# Patient Record
Sex: Male | Born: 1970 | Race: White | Hispanic: No | Marital: Single | Smoking: Current every day smoker
Health system: Southern US, Community
[De-identification: ages and names within clinical notes are randomized; demographics above are authoritative.]

## PROBLEM LIST (undated history)

## (undated) DIAGNOSIS — N200 Calculus of kidney: Secondary | ICD-10-CM

## (undated) DIAGNOSIS — I10 Essential (primary) hypertension: Secondary | ICD-10-CM

## (undated) DIAGNOSIS — K219 Gastro-esophageal reflux disease without esophagitis: Secondary | ICD-10-CM

## (undated) DIAGNOSIS — T7840XA Allergy, unspecified, initial encounter: Secondary | ICD-10-CM

## (undated) DIAGNOSIS — R0902 Hypoxemia: Secondary | ICD-10-CM

## (undated) DIAGNOSIS — E785 Hyperlipidemia, unspecified: Secondary | ICD-10-CM

## (undated) HISTORY — DX: Essential (primary) hypertension: I10

## (undated) HISTORY — DX: Hypoxemia: R09.02

## (undated) HISTORY — PX: LITHOTRIPSY: SUR834

## (undated) HISTORY — PX: WISDOM TOOTH EXTRACTION: SHX21

## (undated) HISTORY — DX: Gastro-esophageal reflux disease without esophagitis: K21.9

## (undated) HISTORY — DX: Allergy, unspecified, initial encounter: T78.40XA

## (undated) HISTORY — PX: CHEST TUBE INSERTION: SHX231

## (undated) HISTORY — DX: Hyperlipidemia, unspecified: E78.5

## (undated) HISTORY — DX: Calculus of kidney: N20.0

## (undated) HISTORY — PX: ANTERIOR CRUCIATE LIGAMENT REPAIR: SHX115

---

## 1998-05-17 ENCOUNTER — Ambulatory Visit (HOSPITAL_COMMUNITY): Admission: RE | Admit: 1998-05-17 | Discharge: 1998-05-17 | Payer: Self-pay | Admitting: Urology

## 2004-05-12 ENCOUNTER — Emergency Department (HOSPITAL_COMMUNITY): Admission: EM | Admit: 2004-05-12 | Discharge: 2004-05-12 | Payer: Self-pay | Admitting: Emergency Medicine

## 2004-11-07 ENCOUNTER — Ambulatory Visit: Payer: Self-pay | Admitting: Internal Medicine

## 2004-11-11 ENCOUNTER — Encounter: Admission: RE | Admit: 2004-11-11 | Discharge: 2004-11-11 | Payer: Self-pay | Admitting: Internal Medicine

## 2006-02-16 IMAGING — CR DG CHEST 2V
2 series · 2 of 2 positions shown · non-contrast
Comparison: none

CLINICAL DATA: coughing blood
 TWO VIEW CHEST
 No comparison.  Heart size is upper normal.  There is mild elevation of the right hemidiaphragm and minimal right lower lobe atelectasis.  Lungs otherwise clear and there is no effusion.
 IMPRESSION
 Very mild right lower lobe atelectasis.

[view not recorded (1 of 2)]
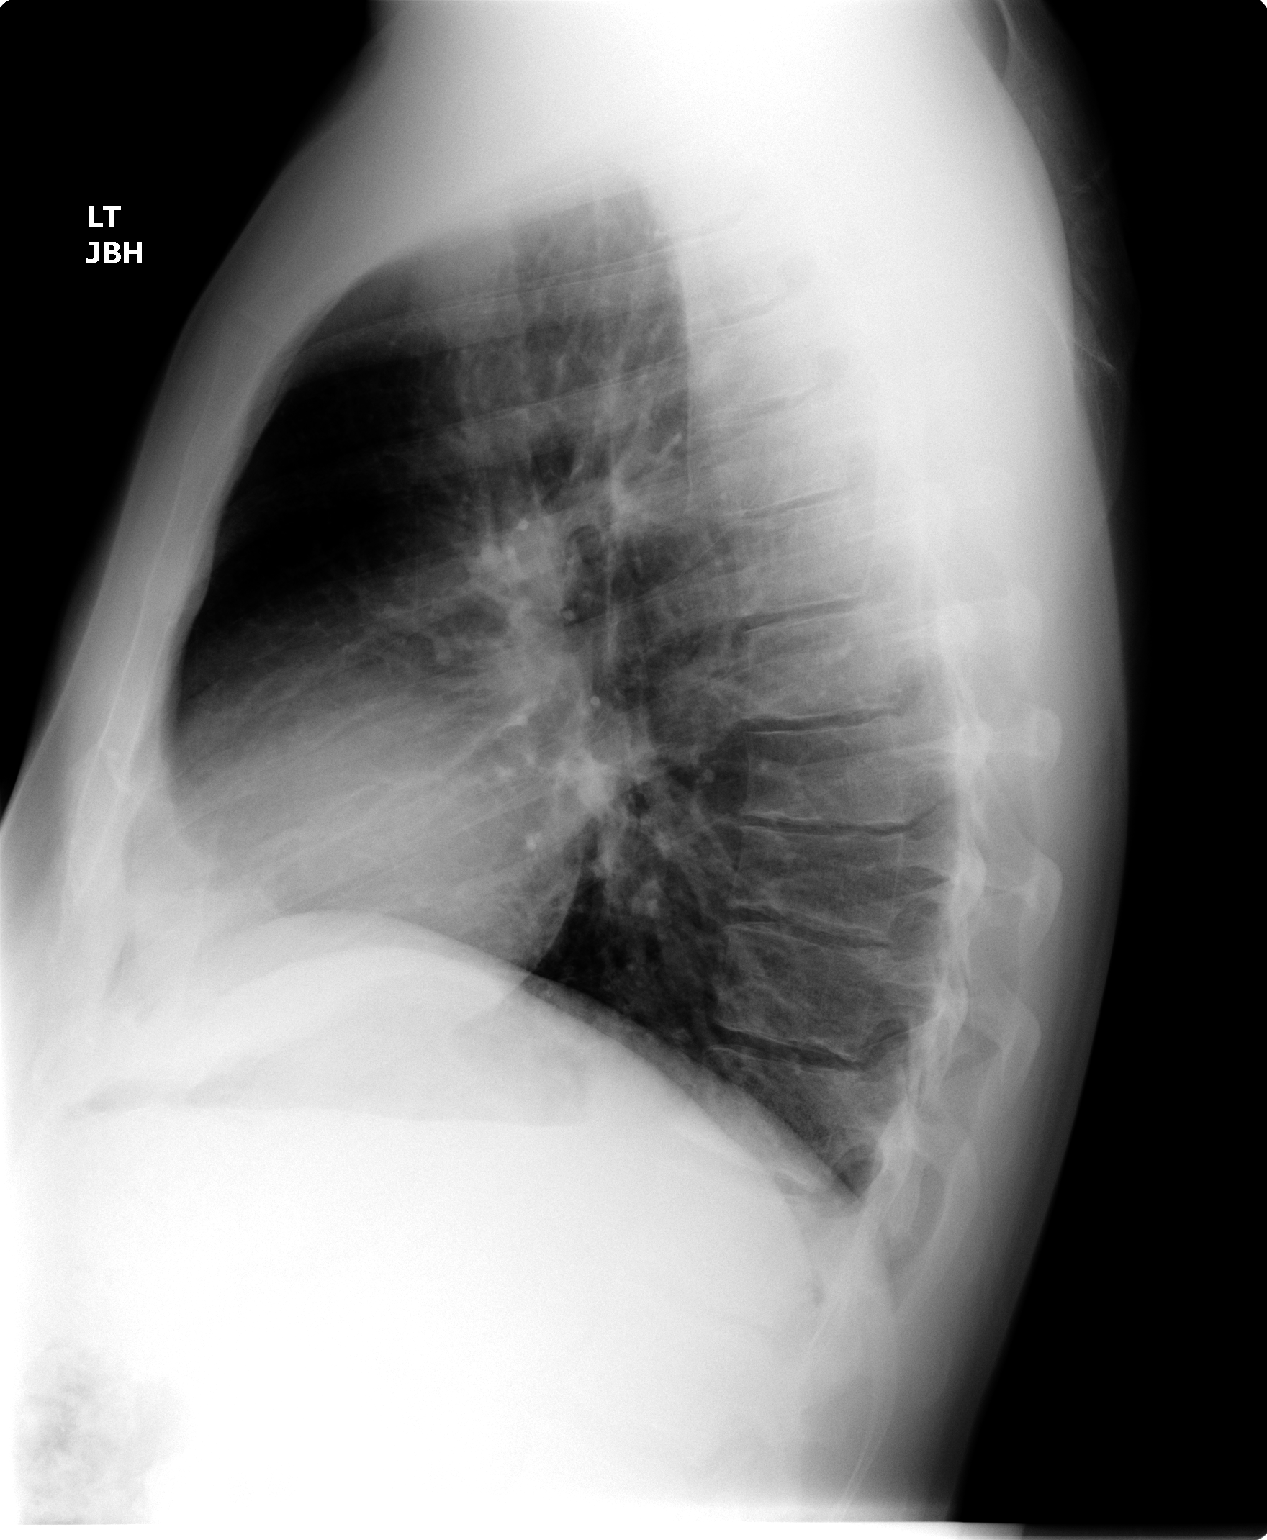

[view not recorded (2 of 2)]
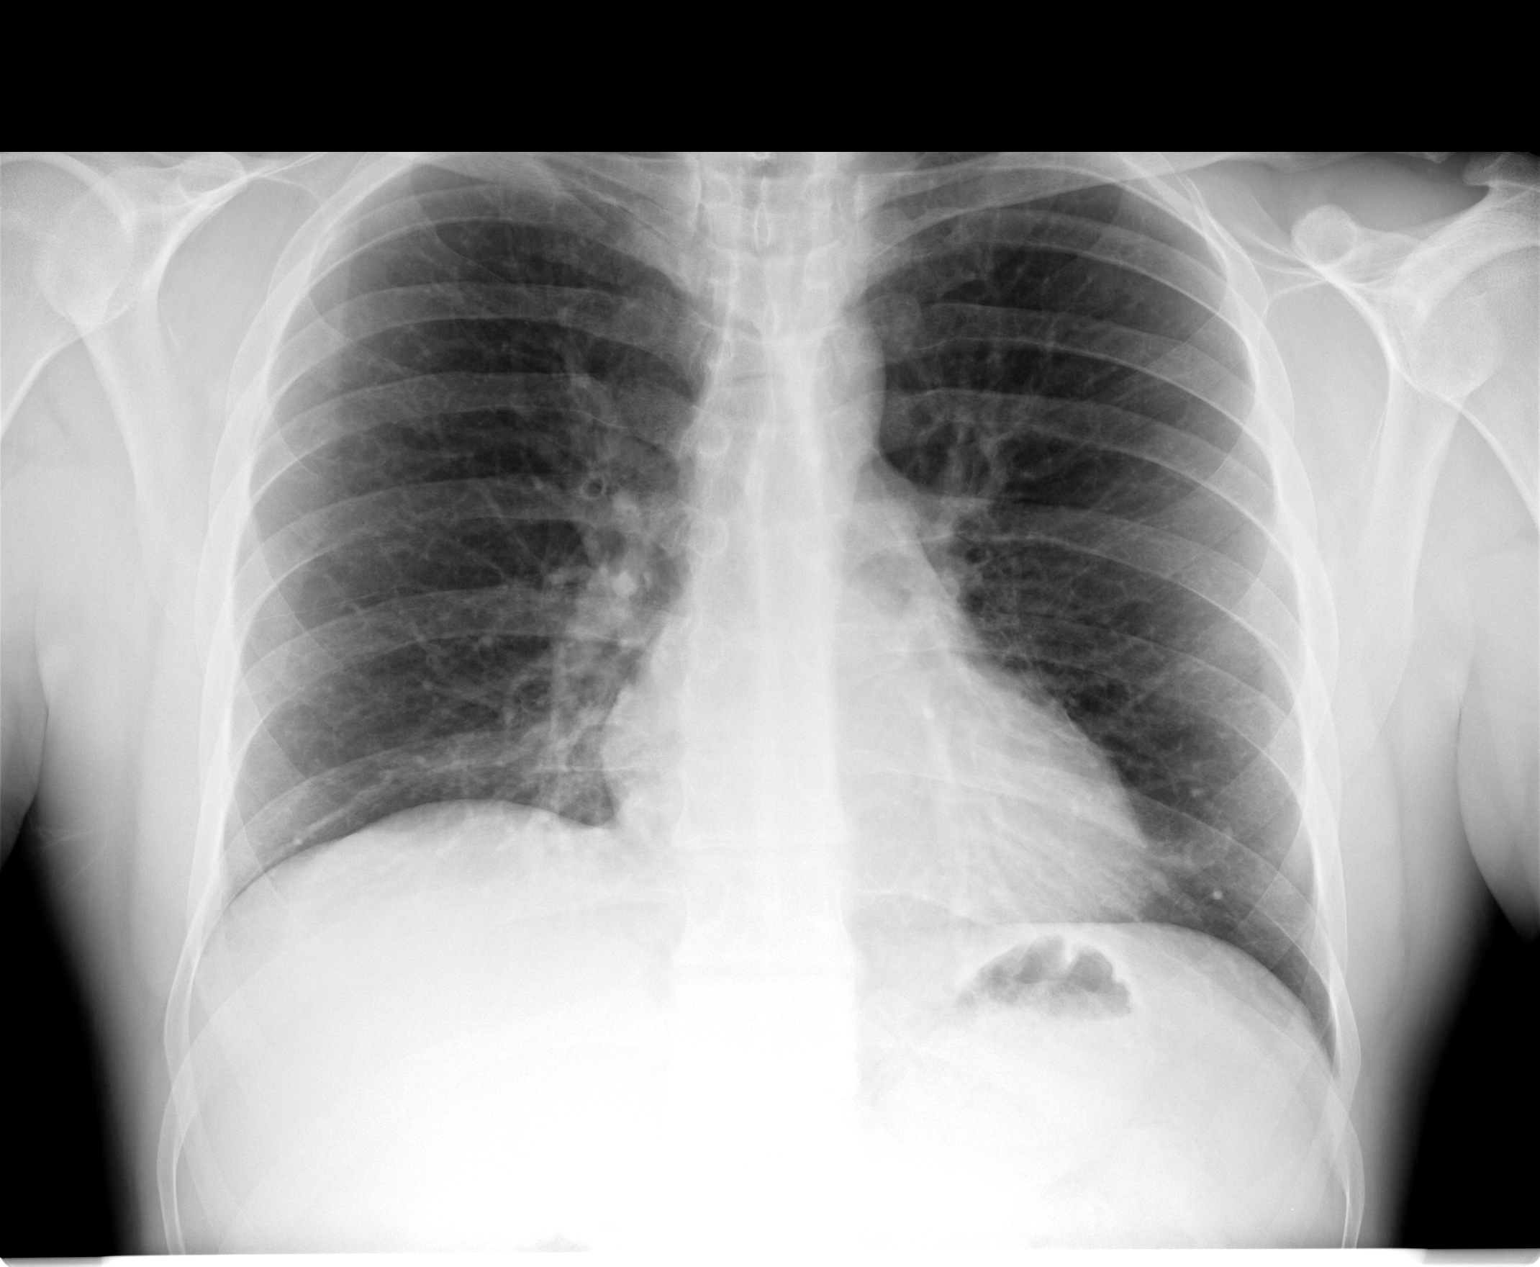

[2 of 2 positions shown; findings below may reference images not displayed]

## 2008-03-08 ENCOUNTER — Ambulatory Visit (HOSPITAL_COMMUNITY): Admission: RE | Admit: 2008-03-08 | Discharge: 2008-03-08 | Payer: Self-pay | Admitting: Urology

## 2011-03-04 NOTE — Op Note (Signed)
NAME:  Joshua Rivers, Joshua Rivers                 ACCOUNT NO.:  [id number]   MEDICAL RECORD NO.:  [id number]          PATIENT TYPE:  AMB   LOCATION:  DAY                          FACILITY:  [facility]   PHYSICIAN:  Maretta Bees. Vonita Moss, M.D.DATE OF BIRTH:  11-26-1970   DATE OF PROCEDURE:  03/08/2008  DATE OF DISCHARGE:                               OPERATIVE REPORT   PREOPERATIVE DIAGNOSIS:  Urethral stone and urinary retention.   POSTOPERATIVE DIAGNOSIS:  Urethral stone and urinary retention, plus  urethral stricture.   PROCEDURE:  Cystoscopy, urethral dilation, and removal of stone from the  urethra and bladder.   SURGEON:  Dr. Larey Dresser.   ANESTHESIA:  General.   INDICATIONS:  This gentleman presented today with left flank pain and  then suprapubic pain and was found to have a distended bladder on a KUB  after doing a CT, showed a large stone in the proximal urethra and I  could not tell whether it was in the distal prostate or bulbous urethra.  He was in severe distress from urinary retention.  He does have a past  history of stones.  I gave him Rapaflo but he did not pass the stone and  had to be brought to the OR.   PROCEDURE:  The patient was brought to the operating room and placed in  the lithotomy position.  The external genitalia were prepped and draped  in the usual fashion.  He was cystoscoped after dilating a mildly  stenotic urethral meatus and I encountered a short and somewhat dense  stricture in the bulbous urethra.  It was not pinpoint but obviously  narrow enough to prevent stone passage.  I had injected the lidocaine  jelly and apparently pushed the stone back in the bladder, where I found  it after I inserted a cystoscope.  His prostate was short and  nonobstructing.  I used a basket to retrieve the stone intact and gave  it to the patient's wife.  I then inserted a guidewire and dilated his  urethral, meatal and bulbous stricture to 48 [place].  In the course of  doing  the procedure, I also decompressed his distended bladder.  He was  then taken to the recovery room in good condition, having tolerated the  procedure well.      Maretta Bees. Vonita Moss, M.D.  Electronically Signed     LJP/MEDQ  D:  03/08/2008  T:  03/08/2008  Job:  [id number]

## 2011-10-04 ENCOUNTER — Ambulatory Visit (INDEPENDENT_AMBULATORY_CARE_PROVIDER_SITE_OTHER): Payer: 59

## 2011-10-04 DIAGNOSIS — L02219 Cutaneous abscess of trunk, unspecified: Secondary | ICD-10-CM

## 2011-10-08 ENCOUNTER — Ambulatory Visit (INDEPENDENT_AMBULATORY_CARE_PROVIDER_SITE_OTHER): Payer: 59

## 2011-10-08 DIAGNOSIS — L0291 Cutaneous abscess, unspecified: Secondary | ICD-10-CM

## 2011-10-08 DIAGNOSIS — L0390 Cellulitis, unspecified: Secondary | ICD-10-CM

## 2012-05-07 ENCOUNTER — Encounter: Payer: Self-pay | Admitting: Internal Medicine

## 2012-05-07 ENCOUNTER — Ambulatory Visit (INDEPENDENT_AMBULATORY_CARE_PROVIDER_SITE_OTHER): Payer: Self-pay | Admitting: Internal Medicine

## 2012-05-07 VITALS — BP 122/78 | HR 102 | Temp 98.6°F | Ht 73.0 in | Wt 174.0 lb

## 2012-05-07 DIAGNOSIS — F341 Dysthymic disorder: Secondary | ICD-10-CM

## 2012-05-07 DIAGNOSIS — F329 Major depressive disorder, single episode, unspecified: Secondary | ICD-10-CM

## 2012-05-07 DIAGNOSIS — F32A Depression, unspecified: Secondary | ICD-10-CM | POA: Insufficient documentation

## 2012-05-07 MED ORDER — ESCITALOPRAM OXALATE 10 MG PO TABS: 10.0000 mg | ORAL_TABLET | Freq: Every day | ORAL | Status: DC
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
## 2012-05-07 NOTE — Patient Instructions (Addendum)
Start Lexapro 10 mg once a day Come back for a checkup in one week If you feel like the suicidal thoughts or thoughts of violence are coming back immediately stop Lexapro and call [phone] Please contact one of the counselors in town. See list.
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
## 2012-05-14 ENCOUNTER — Ambulatory Visit (INDEPENDENT_AMBULATORY_CARE_PROVIDER_SITE_OTHER): Payer: Self-pay | Admitting: Internal Medicine

## 2012-05-14 VITALS — BP 138/84 | HR 104 | Temp 98.1°F | Wt 179.0 lb

## 2012-05-14 DIAGNOSIS — F419 Anxiety disorder, unspecified: Secondary | ICD-10-CM

## 2012-05-14 DIAGNOSIS — F341 Dysthymic disorder: Secondary | ICD-10-CM

## 2012-05-14 MED ORDER — ESCITALOPRAM OXALATE 10 MG PO TABS: 10.0000 mg | ORAL_TABLET | Freq: Every day | ORAL | Status: DC
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
## 2012-05-14 NOTE — Patient Instructions (Addendum)
Please come back for a follow up in 6-8 weeks. Call anytime if   needed.

## 2012-05-14 NOTE — Assessment & Plan Note (Signed)
Here for a followup, fortunately, Lexapro 10 mg is helping, he feels better. Plan: Continue with Lexapro Encourage to seek counseling Followup 6-8 weeks, now also call anytime if needed. Knows to call the crisis line if needed.

## 2012-05-16 ENCOUNTER — Encounter: Payer: Self-pay | Admitting: Internal Medicine

## 2012-07-02 ENCOUNTER — Ambulatory Visit: Payer: 59 | Admitting: Internal Medicine

## 2012-07-02 DIAGNOSIS — Z0289 Encounter for other administrative examinations: Secondary | ICD-10-CM

## 2016-06-24 ENCOUNTER — Encounter: Payer: Self-pay | Admitting: Internal Medicine

## 2016-07-04 ENCOUNTER — Ambulatory Visit (INDEPENDENT_AMBULATORY_CARE_PROVIDER_SITE_OTHER): Payer: 59 | Admitting: Internal Medicine

## 2016-07-04 ENCOUNTER — Encounter: Payer: Self-pay | Admitting: Internal Medicine

## 2016-07-04 VITALS — BP 158/94 | HR 92 | Temp 98.2°F | Resp 16 | Ht 73.0 in | Wt 200.0 lb

## 2016-07-04 DIAGNOSIS — Z136 Encounter for screening for cardiovascular disorders: Secondary | ICD-10-CM | POA: Diagnosis not present

## 2016-07-04 DIAGNOSIS — Z130 Encounter for screening for diseases of the blood and blood-forming organs and certain disorders involving the immune mechanism: Secondary | ICD-10-CM

## 2016-07-04 DIAGNOSIS — Z13220 Encounter for screening for lipoid disorders: Secondary | ICD-10-CM

## 2016-07-04 DIAGNOSIS — R030 Elevated blood-pressure reading, without diagnosis of hypertension: Secondary | ICD-10-CM

## 2016-07-04 DIAGNOSIS — Z131 Encounter for screening for diabetes mellitus: Secondary | ICD-10-CM

## 2016-07-04 DIAGNOSIS — E559 Vitamin D deficiency, unspecified: Secondary | ICD-10-CM

## 2016-07-04 DIAGNOSIS — Z1329 Encounter for screening for other suspected endocrine disorder: Secondary | ICD-10-CM

## 2016-07-04 DIAGNOSIS — F329 Major depressive disorder, single episode, unspecified: Secondary | ICD-10-CM

## 2016-07-04 DIAGNOSIS — E349 Endocrine disorder, unspecified: Secondary | ICD-10-CM

## 2016-07-04 DIAGNOSIS — Z1389 Encounter for screening for other disorder: Secondary | ICD-10-CM

## 2016-07-04 DIAGNOSIS — Z0000 Encounter for general adult medical examination without abnormal findings: Secondary | ICD-10-CM | POA: Diagnosis not present

## 2016-07-04 DIAGNOSIS — I10 Essential (primary) hypertension: Secondary | ICD-10-CM | POA: Diagnosis not present

## 2016-07-04 DIAGNOSIS — Z125 Encounter for screening for malignant neoplasm of prostate: Secondary | ICD-10-CM

## 2016-07-04 DIAGNOSIS — IMO0001 Reserved for inherently not codable concepts without codable children: Secondary | ICD-10-CM

## 2016-07-04 DIAGNOSIS — F419 Anxiety disorder, unspecified: Secondary | ICD-10-CM

## 2016-07-04 LAB — CBC WITH DIFFERENTIAL/PLATELET
Basophils Absolute: 108 cells/uL (ref 0–200)
Basophils Relative: 1 %
EOS ABS: 216 {cells}/uL (ref 15–500)
Eosinophils Relative: 2 %
HEMATOCRIT: 43.3 % (ref 38.5–50.0)
Hemoglobin: 14.9 g/dL (ref 13.2–17.1)
Lymphocytes Relative: 20 %
Lymphs Abs: 2160 cells/uL (ref 850–3900)
MCH: 31 pg (ref 27.0–33.0)
MCHC: 34.4 g/dL (ref 32.0–36.0)
MCV: 90 fL (ref 80.0–100.0)
MPV: 9 fL (ref 7.5–12.5)
Monocytes Absolute: 972 cells/uL — ABNORMAL HIGH (ref 200–950)
Monocytes Relative: 9 %
NEUTROS ABS: 7344 {cells}/uL (ref 1500–7800)
Neutrophils Relative %: 68 %
Platelets: 382 10*3/uL (ref 140–400)
RBC: 4.81 MIL/uL (ref 4.20–5.80)
RDW: 13.2 % (ref 11.0–15.0)
WBC: 10.8 10*3/uL (ref 3.8–10.8)

## 2016-07-04 LAB — PSA: PSA: 0.8 ng/mL (ref <=4.0)

## 2016-07-04 LAB — HEPATIC FUNCTION PANEL
ALK PHOS: 69 U/L (ref 40–115)
ALT: 32 U/L (ref 9–46)
AST: 27 U/L (ref 10–40)
Albumin: 4.4 g/dL (ref 3.6–5.1)
BILIRUBIN DIRECT: 0.1 mg/dL (ref <=0.2)
BILIRUBIN INDIRECT: 0.3 mg/dL (ref 0.2–1.2)
BILIRUBIN TOTAL: 0.4 mg/dL (ref 0.2–1.2)
TOTAL PROTEIN: 6.7 g/dL (ref 6.1–8.1)

## 2016-07-04 LAB — IRON AND TIBC
%SAT: 28 % (ref 15–60)
Iron: 102 ug/dL (ref 50–180)
TIBC: 365 ug/dL (ref 250–425)
UIBC: 263 ug/dL (ref 125–400)

## 2016-07-04 LAB — LIPID PANEL
CHOLESTEROL: 246 mg/dL — AB (ref 125–200)
HDL: 31 mg/dL — ABNORMAL LOW (ref >=40)
LDL Cholesterol: 144 mg/dL — ABNORMAL HIGH (ref <130)
Total CHOL/HDL Ratio: 7.9 Ratio — ABNORMAL HIGH (ref <=5.0)
Triglycerides: 357 mg/dL — ABNORMAL HIGH (ref <150)
VLDL: 71 mg/dL — AB (ref <30)

## 2016-07-04 LAB — VITAMIN B12: Vitamin B-12: 519 pg/mL (ref 200–1100)

## 2016-07-04 LAB — BASIC METABOLIC PANEL WITH GFR
BUN: 11 mg/dL (ref 7–25)
CALCIUM: 9.6 mg/dL (ref 8.6–10.3)
CO2: 26 mmol/L (ref 20–31)
Chloride: 107 mmol/L (ref 98–110)
Creat: 0.85 mg/dL (ref 0.60–1.35)
GLUCOSE: 75 mg/dL (ref 65–99)
POTASSIUM: 4.1 mmol/L (ref 3.5–5.3)
SODIUM: 140 mmol/L (ref 135–146)

## 2016-07-04 LAB — MAGNESIUM: Magnesium: 1.9 mg/dL (ref 1.5–2.5)

## 2016-07-04 LAB — TSH: TSH: 0.65 mIU/L (ref 0.40–4.50)

## 2016-07-04 MED ORDER — VORTIOXETINE HBR 10 MG PO TABS: 10.0000 mg | ORAL_TABLET | Freq: Every day | ORAL | 0 refills | Status: DC

## 2016-07-04 MED ORDER — ALPRAZOLAM 0.5 MG PO TABS: 0.5000 mg | ORAL_TABLET | Freq: Two times a day (BID) | ORAL | 0 refills | Status: DC | PRN

## 2016-07-04 NOTE — Patient Instructions (Signed)
Alprazolam tablets What is this medicine? ALPRAZOLAM (al PRAY zoe lam) is a benzodiazepine. It is used to treat anxiety and panic attacks. This medicine may be used for other purposes; ask your health care provider or pharmacist if you have questions. What should I tell my health care provider before I take this medicine? They need to know if you have any of these conditions: -an alcohol or drug abuse problem -bipolar disorder, depression, psychosis or other mental health conditions -glaucoma -kidney or liver disease -lung or breathing disease -myasthenia gravis -Parkinson's disease -porphyria -seizures or a history of seizures -suicidal thoughts -an unusual or allergic reaction to alprazolam, other benzodiazepines, foods, dyes, or preservatives -pregnant or trying to get pregnant -breast-feeding How should I use this medicine? Take this medicine by mouth with a glass of water. Follow the directions on the prescription label. Take your medicine at regular intervals. Do not take it more often than directed. If you have been taking this medicine regularly for some time, do not suddenly stop taking it. You must gradually reduce the dose or you may get severe side effects. Ask your doctor or health care professional for advice. Even after you stop taking this medicine it can still affect your body for several days. Talk to your pediatrician regarding the use of this medicine in children. Special care may be needed. Overdosage: If you think you have taken too much of this medicine contact a poison control center or emergency room at once. NOTE: This medicine is only for you. Do not share this medicine with others. What if I miss a dose? If you miss a dose, take it as soon as you can. If it is almost time for your next dose, take only that dose. Do not take double or extra doses. What may interact with this medicine? Do not take this medicine with any of the following medications: -certain  medicines for HIV infection or AIDS -ketoconazole -itraconazole This medicine may also interact with the following medications: -birth control pills -certain macrolide antibiotics like clarithromycin, erythromycin, troleandomycin -cimetidine -cyclosporine -ergotamine -grapefruit juice -herbal or dietary supplements like kava kava, melatonin, dehydroepiandrosterone, DHEA, St. John's Wort or valerian -imatinib, STI-571 -isoniazid -levodopa -medicines for depression, anxiety, or psychotic disturbances -prescription pain medicines -rifampin, rifapentine, or rifabutin -some medicines for blood pressure or heart problems -some medicines for seizures like carbamazepine, oxcarbazepine, phenobarbital, phenytoin, primidone This list may not describe all possible interactions. Give your health care provider a list of all the medicines, herbs, non-prescription drugs, or dietary supplements you use. Also tell them if you smoke, drink alcohol, or use illegal drugs. Some items may interact with your medicine. What should I watch for while using this medicine? Visit your doctor or health care professional for regular checks on your progress. Your body can become dependent on this medicine. Ask your doctor or health care professional if you still need to take it. You may get drowsy or dizzy. Do not drive, use machinery, or do anything that needs mental alertness until you know how this medicine affects you. To reduce the risk of dizzy and fainting spells, do not stand or sit up quickly, especially if you are an older patient. Alcohol may increase dizziness and drowsiness. Avoid alcoholic drinks. Do not treat yourself for coughs, colds or allergies without asking your doctor or health care professional for advice. Some ingredients can increase possible side effects. What side effects may I notice from receiving this medicine? Side effects that you should report to your   doctor or health care professional as  soon as possible: -allergic reactions like skin rash, itching or hives, swelling of the face, lips, or tongue -confusion, forgetfulness -depression -difficulty sleeping -difficulty speaking -feeling faint or lightheaded, falls -mood changes, excitability or aggressive behavior -muscle cramps -trouble passing urine or change in the amount of urine -unusually weak or tired Side effects that usually do not require medical attention (report to your doctor or health care professional if they continue or are bothersome): -change in sex drive or performance -changes in appetite This list may not describe all possible side effects. Call your doctor for medical advice about side effects. You may report side effects to FDA at 1-800-FDA-1088. Where should I keep my medicine? Keep out of the reach of children. This medicine can be abused. Keep your medicine in a safe place to protect it from theft. Do not share this medicine with anyone. Selling or giving away this medicine is dangerous and against the law. Store at room temperature between 20 and 25 degrees C (68 and 77 degrees F). This medicine may cause accidental overdose and death if taken by other adults, children, or pets. Mix any unused medicine with a substance like cat litter or coffee grounds. Then throw the medicine away in a sealed container like a sealed bag or a coffee can with a lid. Do not use the medicine after the expiration date. NOTE: This sheet is a summary. It may not cover all possible information. If you have questions about this medicine, talk to your doctor, pharmacist, or health care provider.    2016, Elsevier/Gold Standard. (2014-06-27 14:51:36)  
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
## 2016-07-05 LAB — URINALYSIS, ROUTINE W REFLEX MICROSCOPIC
Bilirubin Urine: NEGATIVE
GLUCOSE, UA: NEGATIVE
Hgb urine dipstick: NEGATIVE
Ketones, ur: NEGATIVE
LEUKOCYTES UA: NEGATIVE
Nitrite: NEGATIVE
PROTEIN: NEGATIVE
Specific Gravity, Urine: 1.005 (ref 1.001–1.035)
pH: 6.5 (ref 5.0–8.0)

## 2016-07-05 LAB — INSULIN, RANDOM: INSULIN: 6.8 u[IU]/mL (ref 2.0–19.6)

## 2016-07-05 LAB — MICROALBUMIN / CREATININE URINE RATIO
CREATININE, URINE: 40 mg/dL (ref 20–370)
MICROALB UR: 0.4 mg/dL
MICROALB/CREAT RATIO: 10 ug/mg{creat} (ref <30)

## 2016-07-05 LAB — TESTOSTERONE: TESTOSTERONE: 317 ng/dL (ref 250–827)

## 2016-07-05 LAB — HEMOGLOBIN A1C
Hgb A1c MFr Bld: 5.2 % (ref <5.7)
MEAN PLASMA GLUCOSE: 103 mg/dL

## 2016-07-05 LAB — VITAMIN D 25 HYDROXY (VIT D DEFICIENCY, FRACTURES): VIT D 25 HYDROXY: 24 ng/mL — AB (ref 30–100)

## 2016-07-30 ENCOUNTER — Ambulatory Visit (INDEPENDENT_AMBULATORY_CARE_PROVIDER_SITE_OTHER): Payer: 59 | Admitting: Internal Medicine

## 2016-07-30 ENCOUNTER — Encounter: Payer: Self-pay | Admitting: Internal Medicine

## 2016-07-30 VITALS — BP 144/82 | HR 88 | Temp 98.0°F | Resp 16 | Ht 73.0 in | Wt 204.0 lb

## 2016-07-30 DIAGNOSIS — I10 Essential (primary) hypertension: Secondary | ICD-10-CM | POA: Diagnosis not present

## 2016-07-30 DIAGNOSIS — F411 Generalized anxiety disorder: Secondary | ICD-10-CM

## 2016-07-30 MED ORDER — BISOPROLOL-HYDROCHLOROTHIAZIDE 5-6.25 MG PO TABS: 1.0000 | ORAL_TABLET | Freq: Every day | ORAL | 3 refills | Status: DC

## 2016-07-30 MED ORDER — VORTIOXETINE HBR 10 MG PO TABS: 10.0000 mg | ORAL_TABLET | Freq: Every day | ORAL | 1 refills | Status: DC

## 2016-07-30 NOTE — Patient Instructions (Signed)
Bisoprolol; Hydrochlorothiazide, HCTZ tablets  What is this medicine?  BISOPROLOL; HYDROCHLOROTHIAZIDE (bis OH proe lol; hye droe klor oh THYE a zide) is a combination of a beta-blocker and a diuretic. It is used to treat high blood pressure.  This medicine may be used for other purposes; ask your health care provider or pharmacist if you have questions.  What should I tell my health care provider before I take this medicine?  They need to know if you have any of these conditions:  -circulation problems, or blood vessel disease  -decreased urine  -diabetes  -heart disease, heart failure or a history of heart attack  -kidney disease  -liver disease  -lung or breathing disease, like asthma  -slow heart rate  -thyroid disease  -an unusual or allergic reaction to hydrochlorothiazide, bisoprolol, sulfa drugs, other medicines, foods, dyes, or preservatives  -pregnant or trying to get pregnant  -breast-feeding  How should I use this medicine?  Take this medicine by mouth with a glass of water. Follow the directions on the prescription label. You can take it with or without food. If it upsets your stomach, take it with food. Take your medicine at regular intervals. Do not take it more often than directed. Do not stop taking except on your doctor's advice.  Talk to your pediatrician regarding the use of this medicine in children. Special care may be needed.  Overdosage: If you think you have taken too much of this medicine contact a poison control center or emergency room at once.  NOTE: This medicine is only for you. Do not share this medicine with others.  What if I miss a dose?  If you miss a dose, take it as soon as you can. If it is almost time for your next dose, take only that dose. Do not take double or extra doses.  What may interact with this medicine?  -barbiturates, like phenobarbital  -cholestyramine  -colestipol  -corticosteroids, like prednisone  -lithium  -medicines for chest pain or angina  -medicines for  diabetes  -medicines for high blood pressure or heart failure  -medicines to control heart rhythm  -NSAIDs, medicines for pain and inflammation, like ibuprofen or naproxen  -prescription pain medicines  -rifampin  -skeletal muscle relaxants like tubocurarine  This list may not describe all possible interactions. Give your health care provider a list of all the medicines, herbs, non-prescription drugs, or dietary supplements you use. Also tell them if you smoke, drink alcohol, or use illegal drugs. Some items may interact with your medicine.  What should I watch for while using this medicine?  Visit your doctor or health care professional for regular checks on your progress. Check your blood pressure as directed. Ask your doctor or health care professional what your blood pressure should be and when you should contact him or her.  Check with your doctor or health care professional if you get an attack of severe diarrhea, nausea and vomiting, or if you sweat a lot. The loss of too much body fluid can make it dangerous for you to take this medicine.  You may get drowsy or dizzy. Do not drive, use machinery, or do anything that needs mental alertness until you know how this drug affects you. Do not stand or sit up quickly, especially if you are an older patient. This reduces the risk of dizzy or fainting spells. Alcohol can make you more drowsy and dizzy. Avoid alcoholic drinks.  This medicine may affect your blood sugar level. If you   have diabetes, check with your doctor or health care professional before changing the dose of your diabetic medicine.  This medicine can make you more sensitive to the sun. Keep out of the sun. If you cannot avoid being in the sun, wear protective clothing and use sunscreen. Do not use sun lamps or tanning beds/booths.  Do not treat yourself for coughs, colds, or pain while you are taking this medicine without asking your doctor or health care professional for advice. Some ingredients may  increase your blood pressure.  What side effects may I notice from receiving this medicine?  Side effects that you should report to your doctor or health care professional as soon as possible:  -allergic reactions like skin rash, itching or hives, swelling of the face, lips, or tongue  -breathing problems  -changes in vision  -chest pain  -cold, tingling, or numb hands or feet  -eye pain  -fast, irregular, or slow heartbeat  -increased thirst or sweating  -muscle cramps  -redness, blistering, peeling or loosening of the skin, including inside the mouth  -swollen legs or ankles  -tremors  -unusual bruising  -unusual weak or tired  -vomiting  -worsened gout pain  -yellowing of the eyes or skin  Side effects that usually do not require medical attention (report to your doctor or health care professional if they continue or are bothersome):  -change in sex drive or performance  -cough  -depression  -diarrhea  -nausea  This list may not describe all possible side effects. Call your doctor for medical advice about side effects. You may report side effects to FDA at 1-800-FDA-1088.  Where should I keep my medicine?  Keep out of the reach of children.  Store at room temperature between 15 and 30 degrees C (59 and 86 degrees F). Keep container tightly closed. Throw away any unused medicine after the expiration date.  NOTE: This sheet is a summary. It may not cover all possible information. If you have questions about this medicine, talk to your doctor, pharmacist, or health care provider.     © 2016, Elsevier/Gold Standard. (2010-06-26 12:53:54)

## 2016-07-30 NOTE — Progress Notes (Signed)
   Subjective:    Patient ID: Joshua Rivers, male    DOB: October 17, 1971, 45 y.o.   MRN: [id number]  HPI  Patient reports that he has been having some high blood pressure readings at work and also at home.  He reports that he had a high reading at work a couple days ago of 170/116.  He was asymptomatic.  He had no headache, no blurry vision, no double vision.  He reports that he felt fine.  He was having readings of 162/100, and 150/97.  He reports that these were all taken at work.  He has a machine that he can check at the [place].     Review of Systems  Constitutional: Negative for chills, fatigue and fever.  Respiratory: Negative for chest tightness and shortness of breath.   Cardiovascular: Negative for chest pain and palpitations.  Gastrointestinal: Negative for abdominal pain, constipation, diarrhea, nausea and vomiting.  Genitourinary: Negative for difficulty urinating.  Neurological: Negative for dizziness, syncope, weakness and light-headedness.  Psychiatric/Behavioral: Negative for confusion and decreased concentration.       Objective:   Physical Exam  Constitutional: He is oriented to person, place, and time. He appears well-developed and well-nourished. No distress.  HENT:  Head: Normocephalic.  Mouth/Throat: Oropharynx is clear and moist. No oropharyngeal exudate.  Eyes: Conjunctivae are normal. No scleral icterus.  Neck: Normal range of motion. Neck supple. No JVD present. No thyromegaly present.  Cardiovascular: Normal rate, regular rhythm, normal heart sounds and intact distal pulses.  Exam reveals no gallop and no friction rub.   No murmur heard. Pulmonary/Chest: Effort normal and breath sounds normal. No respiratory distress. He has no wheezes. He has no rales. He exhibits no tenderness.  Abdominal: Soft. Bowel sounds are normal. He exhibits no distension and no mass. There is no tenderness. There is no rebound and no guarding.  Musculoskeletal: Normal range of  motion.  Lymphadenopathy:    He has no cervical adenopathy.  Neurological: He is alert and oriented to person, place, and time. No cranial nerve deficit. Coordination normal.  Skin: Skin is warm and dry. No rash noted. He is not diaphoretic.  Psychiatric: He has a normal mood and affect. His behavior is normal. Judgment and thought content normal.  Nursing note and vitals reviewed.   Vitals:   07/30/16 0942  BP: (!) 144/82  Pulse: 88  Resp: 16  Temp: 98 F (36.7 C)          Assessment & Plan:    1. Benign essential HTN -monitor BP -ziac 5/6.25 -call office if BP 150/90 or more -repeat OV Nov 1,2017  2. Generalized anxiety disorder -good relief with trintellix -cont meds and refill sent in

## 2016-08-20 ENCOUNTER — Ambulatory Visit (INDEPENDENT_AMBULATORY_CARE_PROVIDER_SITE_OTHER): Payer: 59 | Admitting: Internal Medicine

## 2016-08-20 ENCOUNTER — Encounter: Payer: Self-pay | Admitting: Internal Medicine

## 2016-08-20 VITALS — BP 124/70 | HR 78 | Temp 98.2°F | Resp 18 | Ht 73.0 in

## 2016-08-20 DIAGNOSIS — I10 Essential (primary) hypertension: Secondary | ICD-10-CM

## 2016-08-20 DIAGNOSIS — F418 Other specified anxiety disorders: Secondary | ICD-10-CM

## 2016-08-20 DIAGNOSIS — F329 Major depressive disorder, single episode, unspecified: Secondary | ICD-10-CM

## 2016-08-20 DIAGNOSIS — F419 Anxiety disorder, unspecified: Principal | ICD-10-CM

## 2016-08-20 HISTORY — DX: Essential (primary) hypertension: I10

## 2016-08-20 MED ORDER — VORTIOXETINE HBR 10 MG PO TABS: 10.0000 mg | ORAL_TABLET | Freq: Every day | ORAL | 1 refills | Status: DC

## 2016-08-20 MED ORDER — VARENICLINE TARTRATE 1 MG PO TABS: 1.0000 mg | ORAL_TABLET | Freq: Two times a day (BID) | ORAL | 1 refills | Status: DC

## 2016-08-20 MED ORDER — ALPRAZOLAM 0.5 MG PO TABS: 0.5000 mg | ORAL_TABLET | Freq: Two times a day (BID) | ORAL | 2 refills | Status: DC | PRN

## 2016-08-20 NOTE — Progress Notes (Signed)
Assessment and Plan:   1. Essential hypertension -cont ziac -well controlled -dash diet -exercise as tolerated  2. Anxiety and depression -cont trintellix -refilled xanax and gave a refill on paper prescriptions.    HPI 45 y.o.male presents for 1 month follow up of HTN. Patient reports that they have been doing well.  male is taking their medication.  They are not having difficulty with their medications.  They report no adverse reactions.    He reports that he is doing really well with trintellix too.  He is taking the xanax as sparingly as possible.  He sometimes does have to take it twice daily but sometimes he is not taking anything at all.      Past Medical History:  Diagnosis Date  . Allergy   . GERD (gastroesophageal reflux disease)    takes prilosec every day  . Kidney stones    last stone was in 2010     No Known Allergies    Current Outpatient Prescriptions on File Prior to Visit  Medication Sig Dispense Refill  . ALPRAZolam (XANAX) 0.5 MG tablet Take 1 tablet (0.5 mg total) by mouth 2 (two) times daily as needed for anxiety (severe anxiety). 60 tablet 0  . bisoprolol-hydrochlorothiazide (ZIAC) 5-6.25 MG tablet Take 1 tablet by mouth daily. 30 tablet 3  . varenicline (CHANTIX PAK) 0.5 MG X 11 & 1 MG X 42 tablet Take by mouth 2 (two) times daily. Take one 0.5 mg tablet by mouth once daily for 3 days, then increase to one 0.5 mg tablet twice daily for 4 days, then increase to one 1 mg tablet twice daily.    Marland Kitchen. vortioxetine HBr (TRINTELLIX) 10 MG TABS Take 1 tablet (10 mg total) by mouth daily. 90 tablet 1   No current facility-administered medications on file prior to visit.     ROS: all negative except above.   Physical Exam: There were no vitals filed for this visit. BP 124/70   Pulse 78   Temp 98.2 F (36.8 C) (Temporal)   Resp 18   Ht 6\' 1"  (1.854 m)  General Appearance: Well developed well nourished, non-toxic appearing in no apparent distress. Eyes:  PERRLA, EOMs, conjunctiva w/ no swelling or erythema or discharge Sinuses: No Frontal/maxillary tenderness ENT/Mouth: Ear canals clear without swelling or erythema.  TM's normal bilaterally with no retractions, bulging, or loss of landmarks.   Neck: Supple, thyroid normal, no notable JVD  Respiratory: Respiratory effort normal, Clear breath sounds anteriorly and posteriorly bilaterally without rales, rhonchi, wheezing or stridor. No retractions or accessory muscle usage. Cardio: RRR with no MRGs.   Abdomen: Soft, + BS.  Non tender, no guarding, rebound, hernias, masses.  Musculoskeletal: Full ROM, 5/5 strength, normal gait.  Skin: Warm, dry without rashes  Neuro: Awake and oriented X 3, Cranial nerves intact. Normal muscle tone, no cerebellar symptoms. Sensation intact.  Psych: normal affect, Insight and Judgment appropriate.     Terri Piedraourtney Forcucci, PA-C 10:48 AM [facility]

## 2016-11-23 ENCOUNTER — Other Ambulatory Visit: Payer: Self-pay | Admitting: Internal Medicine

## 2017-01-02 ENCOUNTER — Other Ambulatory Visit: Payer: Self-pay | Admitting: Internal Medicine

## 2017-01-03 NOTE — Telephone Encounter (Signed)
Please call Alprazolam 

## 2017-03-03 ENCOUNTER — Ambulatory Visit: Payer: Self-pay | Admitting: Internal Medicine
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
## 2017-03-17 ENCOUNTER — Ambulatory Visit (INDEPENDENT_AMBULATORY_CARE_PROVIDER_SITE_OTHER): Payer: 59 | Admitting: Physician Assistant

## 2017-03-17 ENCOUNTER — Encounter: Payer: Self-pay | Admitting: Physician Assistant

## 2017-03-17 VITALS — BP 120/88 | HR 78 | Temp 97.9°F | Resp 16 | Ht 73.0 in | Wt 208.6 lb

## 2017-03-17 DIAGNOSIS — I10 Essential (primary) hypertension: Secondary | ICD-10-CM

## 2017-03-17 DIAGNOSIS — F419 Anxiety disorder, unspecified: Secondary | ICD-10-CM

## 2017-03-17 DIAGNOSIS — F329 Major depressive disorder, single episode, unspecified: Secondary | ICD-10-CM

## 2017-03-17 NOTE — Patient Instructions (Signed)
Your LDL is not in range, 07/04/2016: LDL Cholesterol 144. Your LDL is the bad cholesterol that can lead to heart attack and stroke. To lower your number you can decrease your fatty foods, red meat, cheese, milk and increase fiber like whole grains and veggies. You can also add a fiber supplement like Metamucil or Benefiber.   Your trigs are not in range, 07/04/2016: Triglycerides 357. Triglycerides are simple sugars in blood that are converted into a storage form. I recommend you avoid fried/greasy foods, sweets/candy, white rice , white potatoes,  anything made from white flour, sweet tea, soda, fruit juices and avoid alcohol in excess. Sweet potatoes, brown/wild rice/Quinoa, Vegetarian, spinach, or wheat pasta, Multi-grain bread - like multi-grain flat bread or sandwich thins are okay.   Cholesterol Cholesterol is a white, waxy, fat-like substance that is needed by the human body in small amounts. The liver makes all the cholesterol we need. Cholesterol is carried from the liver by the blood through the blood vessels. Deposits of cholesterol (plaques) may build up on blood vessel (artery) walls. Plaques make the arteries narrower and stiffer. Cholesterol plaques increase the risk for heart attack and stroke. You cannot feel your cholesterol level even if it is very high. The only way to know that it is high is to have a blood test. Once you know your cholesterol levels, you should keep a record of the test results. Work with your health care provider to keep your levels in the desired range. What do the results mean?  Total cholesterol is a rough measure of all the cholesterol in your blood.  LDL (low-density lipoprotein) is the "bad" cholesterol. This is the type that causes plaque to build up on the artery walls. You want this level to be low.  HDL (high-density lipoprotein) is the "good" cholesterol because it cleans the arteries and carries the LDL away. You want this level to be  high.  Triglycerides are fat that the body can either burn for energy or store. High levels are closely linked to heart disease. What are the desired levels of cholesterol?  Total cholesterol below 200.  LDL below 100 for people who are at risk, below 70 for people at very high risk.  HDL above 40 is good. A level of 60 or higher is considered to be protective against heart disease.  Triglycerides below 150. How can I lower my cholesterol? Diet  Follow your diet program as told by your health care provider.  Choose fish or white meat chicken and [place], roasted or baked. Limit fatty cuts of red meat, fried foods, and processed meats, such as sausage and lunch meats.  Eat lots of fresh fruits and vegetables.  Choose whole grains, beans, pasta, potatoes, and cereals.  Choose olive oil, corn oil, or canola oil, and use only small amounts.  Avoid butter, mayonnaise, shortening, or palm kernel oils.  Avoid foods with trans fats.  Drink skim or nonfat milk and eat low-fat or nonfat yogurt and cheeses. Avoid whole milk, cream, ice cream, egg yolks, and full-fat cheeses.  Healthier desserts include angel food cake, ginger snaps, animal crackers, hard candy, popsicles, and low-fat or nonfat frozen yogurt. Avoid pastries, cakes, pies, and cookies. Exercise  Follow your exercise program as told by your health care provider. A regular program:  Helps to decrease LDL and raise HDL.  Helps with weight control.  Do things that increase your activity level, such as gardening, walking, and taking the stairs.  Ask your health  care provider about ways that you can be more active in your daily life. Medicine  Take over-the-counter and prescription medicines only as told by your health care provider.  Medicine may be prescribed by your health care provider to help lower cholesterol and decrease the risk for heart disease. This is usually done if diet and exercise have failed to bring down  cholesterol levels.  If you have several risk factors, you may need medicine even if your levels are normal. This information is not intended to replace advice given to you by your health care provider. Make sure you discuss any questions you have with your health care provider. Document Released: 07/01/2001 Document Revised: 05/03/2016 Document Reviewed: 04/05/2016 Elsevier Interactive Patient Education  2017 [employer].

## 2017-04-09 ENCOUNTER — Ambulatory Visit
Admission: RE | Admit: 2017-04-09 | Discharge: 2017-04-09 | Disposition: A | Discharge status: Home / Self Care | Payer: Worker's Compensation | Source: Ambulatory Visit | Attending: Nurse Practitioner | Admitting: Nurse Practitioner

## 2017-04-09 ENCOUNTER — Other Ambulatory Visit: Payer: Self-pay | Admitting: Nurse Practitioner

## 2017-04-09 DIAGNOSIS — T1490XA Injury, unspecified, initial encounter: Secondary | ICD-10-CM

## 2017-05-27 ENCOUNTER — Encounter: Payer: Self-pay | Admitting: Internal Medicine

## 2017-05-27 ENCOUNTER — Ambulatory Visit: Payer: Self-pay | Admitting: Internal Medicine

## 2017-05-27 VITALS — BP 124/78 | HR 72 | Temp 97.8°F | Resp 16 | Ht 73.0 in | Wt 216.6 lb

## 2017-05-27 DIAGNOSIS — L820 Inflamed seborrheic keratosis: Secondary | ICD-10-CM

## 2017-05-27 DIAGNOSIS — L918 Other hypertrophic disorders of the skin: Secondary | ICD-10-CM

## 2017-05-27 NOTE — Progress Notes (Signed)
  Subjective:    Patient ID: Joshua Rivers, male    DOB: [birth year], 46 y.o.   MRN: [id number]  HPI  This very nice 46 yo single blond hair and blue eyed WM  suspect of [place] presents for evaluation of multiple irritated seborrheic seborrheic keratoses about the scalp and and multiple skin tags of the neck.   Medication Sig  . ALPRAZolam  0.5 MG tablet TAKE 1 TABLET BY MOUTH TWICE A DAY AS NEEDED FOR ANXIETY  . bisoprolol-hctz 5-6.25 MG  TAKE 1 TABLET BY MOUTH DAILY.  Marland Kitchen. TRINTELLIX 10 MG TABS Take 1 tablet (10 mg total) by mouth daily.   No Known Allergies  Review of Systems  10 point systems review negative except as above.    Objective:   Physical Exam   BP 124/78   Pulse 72   Temp 97.8 F (36.6 C)   Resp 16   Ht 6\' 1"  (1.854 m)   Wt 216 lb 9.6 oz (98.2 kg)   BMI 28.58 kg/m   There was a large irritated seb keratosis  Measuring approx 1.75" x 1" over the Rt frontotemporal scalp line and several surrounding similar lesions measuring approx 1/2" x 3/8-1/2' bordering the 1st lesion.  After informed consent and achieving adequate analgesia with 3 ml of Marcaine 0.5%,  the lesions were sharply excised by shave excisional technique with a #10 scalpel and the bleeders were cauterized lightly by hyfrecation at power=5.  Then the lesions were treated with Liq Nitrogen spray by a triple freeze/thaw technique. After that #8 large skin tags measuring 3-5 mm were treated by liquid nitrogen cryotherapy.  Patient was ib=nstructed in wound care and sterile dsg was applied.     Assessment & Plan:   1. Seborrheic keratoses, inflamed  - procedure CPT : 17000  2. Skin tags, multiple acquired  - procedure CPT: 17003 x 8

## 2017-05-29 ENCOUNTER — Other Ambulatory Visit: Payer: Self-pay | Admitting: Internal Medicine

## 2017-06-04 ENCOUNTER — Other Ambulatory Visit: Payer: Self-pay | Admitting: Internal Medicine

## 2017-06-04 NOTE — Telephone Encounter (Signed)
Please call Alprazolam 

## 2017-06-19 ENCOUNTER — Encounter: Payer: Self-pay | Admitting: Internal Medicine

## 2017-06-19 ENCOUNTER — Ambulatory Visit (INDEPENDENT_AMBULATORY_CARE_PROVIDER_SITE_OTHER): Payer: 59 | Admitting: Internal Medicine

## 2017-06-19 VITALS — BP 122/84 | HR 76 | Temp 97.3°F | Resp 18 | Ht 73.0 in | Wt 215.4 lb

## 2017-06-19 DIAGNOSIS — I10 Essential (primary) hypertension: Secondary | ICD-10-CM | POA: Diagnosis not present

## 2017-06-19 DIAGNOSIS — L820 Inflamed seborrheic keratosis: Secondary | ICD-10-CM

## 2017-06-19 DIAGNOSIS — L918 Other hypertrophic disorders of the skin: Secondary | ICD-10-CM | POA: Diagnosis not present

## 2017-06-19 NOTE — Progress Notes (Signed)
     Patient is a nice 46 yo WM with HTN returns for BP recheck. About 3 weeks ago he had shave excision and electrocautery of several large inflamed seborrheic keratoses of the Rt frontotemporal area and Cryo of multiple large skin tags. He expresses satisfaction with the results from recent procedures and presents with complaints of several more pruritic large inflamed Seborrheic keratoses of the Rt temporal and frontal hairline.    Medication Sig  . ALPRAZolam (XANAX) 0.5 MG tablet Take 1/2 to 1 tablet 1 or 2 x / day if needed for Anxiety Attacks  . bisoprolol-hydrochlorothiazide (ZIAC) 5-6.25 MG tablet TAKE 1 TABLET BY MOUTH DAILY  . vortioxetine HBr (TRINTELLIX) 10 MG TABS Take 1 tablet (10 mg total) by mouth daily.   No Known Allergies   Past Medical History:  Diagnosis Date  . Allergy   . GERD (gastroesophageal reflux disease)    takes prilosec every day  . HTN (hypertension) 08/20/2016  . Kidney stones    last stone was in 2010   BP 122/84   Pulse 76   Temp (!) 97.3 F (36.3 C)   Resp 18   Ht 6\' 1"  (1.854 m)   Wt 215 lb 6.4 oz (97.7 kg)   BMI 28.42 kg/m    BP 122/84   Pulse 76   Temp (!) 97.3 F (36.3 C)   Resp 18   Ht 6\' 1"  (1.854 m)   Wt 215 lb 6.4 oz (97.7 kg)   BMI 28.42 kg/m   HEENT - WNL. Neck - supple.  Chest - Clear equal BS. Cor - Nl HS. RRR w/o sig MGR. PP 1(+). No edema. MS- FROM w/o deformities.  Gait Nl. Neuro -  Nl w/o focal abnormalities.  Skin- there are # 4 large inflamed excoriated medium/dark brown raised seb. Keratoses measuring from 1 -2 x 1 - 3 cm.    Procedure (CPT 11311) and (CPT 11300)-  After informed consent and aseptic alcohol prep and local anesthesia w/ 2 ml Marcaine 0.5% intradermal, the areas were superficially excised by shave excisional technique with a # 10 scalpel and the areas were lightly hyfrecated.   Procedure (CPT 11200) -  approx # 25 skin tags of varying sizes from 3-12 mm over both axillae and bilateral lower neck  were sharply excised and bases hyfrecated for hemostasis. The larger based tags were likewise anesthetized with  2 ml Marcaine 0.5% in toto intradermal.   Wound care was advised.  Impression  1) HTN  2) Inflamed Seborrheic Keratoses of face / scalp  3) Skin tags neck & trunk

## 2017-07-29 ENCOUNTER — Other Ambulatory Visit: Payer: Self-pay

## 2017-07-29 MED ORDER — VORTIOXETINE HBR 10 MG PO TABS: 10.0000 mg | ORAL_TABLET | Freq: Every day | ORAL | 1 refills | Status: DC

## 2017-10-07 ENCOUNTER — Other Ambulatory Visit: Payer: Self-pay | Admitting: Internal Medicine

## 2017-10-07 NOTE — Telephone Encounter (Signed)
Rx called in 

## 2017-10-26 NOTE — Patient Instructions (Signed)

## 2017-10-26 NOTE — Progress Notes (Signed)
[facility]   Lucky Cowboy, M.D.     Dyanne Carrel. Steffanie Dunn, P.A.-C Judd Gaudier, DNP [facility]                [address]                [place], [place]. [address] Telephone [phone] Telefax [phone] Annual  Screening/Preventative Visit  & Comprehensive Evaluation & Examination     This very nice 47 y.o. Rivers presents for a Screening/Preventative Visit & comprehensive evaluation and management of multiple medical co-morbidities.  Patient has been followed expectantly for HTN, Prediabetes, Hyperlipidemia and Vitamin D Deficiency.     HTN predates since Oct 2017. Patient's BP has been controlled at home.  Today's BP is elevated at 140/98 and rechecked at 153/90.  Patient denies any cardiac symptoms as chest pain, palpitations, shortness of breath, dizziness or ankle swelling.     Patient's hyperlipidemia is not controlled with diet and medications. Patient denies myalgias or other medication SE's. Last lipids were not at goal: Lab Results  Component Value Date   CHOL 246 (H) 07/04/2016   HDL 31 (L) 07/04/2016   LDLCALC 144 (H) 07/04/2016   TRIG 357 (H) 07/04/2016   CHOLHDL 7.9 (H) 07/04/2016      Patient is screened expectantly for  prediabetes and patient denies reactive hypoglycemic symptoms, visual blurring, diabetic polys or paresthesias. Last A1c was Normal at goal: Lab Results  Component Value Date   HGBA1C 5.2 07/04/2016       Finally, patient has history of Vitamin D Deficiency and last vitamin D was not at goal: Lab Results  Component Value Date   VD25OH 24 (L) 07/04/2016   Current Outpatient Medications on File Prior to Visit  Medication Sig  . ALPRAZolam (XANAX) 0.5 MG tablet TAKE 1/2 TO 1 TABLET BY MOUTH 1 TO 2 TIMES DAILY AS NEEDED FOR ANXIETY  . bisoprolol-hydrochlorothiazide (ZIAC) 5-6.25 MG tablet TAKE 1 TABLET BY MOUTH DAILY  . vortioxetine HBr (TRINTELLIX) 10 MG TABS Take 1 tablet (10  mg total) by mouth daily.   No current facility-administered medications on file prior to visit.    No Known Allergies   Past Medical History:  Diagnosis Date  . Allergy   . GERD (gastroesophageal reflux disease)    takes prilosec every day  . HTN (hypertension) 08/20/2016  . Kidney stones    last stone was in 2010   Health Maintenance  Topic Date Due  . HIV Screening  10/01/1987  . TETANUS/TDAP  10/01/1991  . INFLUENZA VACCINE  05/20/2017    There is no immunization history on file for this patient. Last Colon -  Past Surgical History:  Procedure Laterality Date  . ANTERIOR CRUCIATE LIGAMENT REPAIR Left   . CHEST TUBE INSERTION    . LITHOTRIPSY    . WISDOM TOOTH EXTRACTION     Family History  Problem Relation Age of Onset  . Hypertension Mother   . Stroke Mother   . Diabetes Mother   . Kidney disease Paternal Grandfather   . Heart disease Father 44       pacemaker and MI  . Diabetes Father    Social History   Socioeconomic History  . Marital status: Married    Spouse name: Not on file  Tobacco Use  . Smoking status: Former Smoker    Packs/day: 1.00    Years: 25.00    Pack years: 25.00  Types: Cigarettes  . Smokeless tobacco: Never Used  . Tobacco comment: currently on chantix  Substance and Sexual Activity  . Alcohol use: Not on file  . Drug use: Yes    Types: Marijuana  . Sexual activity: Not on file  Other Topics Concern  . Not on file  Social History Narrative  . Not on file    ROS Constitutional: Denies fever, chills, weight loss/gain, headaches, insomnia,  night sweats or change in appetite. Does c/o fatigue. Eyes: Denies redness, blurred vision, diplopia, discharge, itchy or watery eyes.  ENT: Denies discharge, congestion, post nasal drip, epistaxis, sore throat, earache, hearing loss, dental pain, Tinnitus, Vertigo, Sinus pain or snoring.  Cardio: Denies chest pain, palpitations, irregular heartbeat, syncope, dyspnea, diaphoresis,  orthopnea, PND, claudication or edema Respiratory: denies cough, dyspnea, DOE, pleurisy, hoarseness, laryngitis or wheezing.  Gastrointestinal: Denies dysphagia, heartburn, reflux, water brash, pain, cramps, nausea, vomiting, bloating, diarrhea, constipation, hematemesis, melena, hematochezia, jaundice or hemorrhoids Genitourinary: Denies dysuria, frequency, urgency, nocturia, hesitancy, discharge, hematuria or flank pain Musculoskeletal: Denies arthralgia, myalgia, stiffness, Jt. Swelling, pain, limp or strain/sprain. Denies Falls. Skin: Denies puritis, rash, hives, warts, acne, eczema or change in skin lesion Neuro: No weakness, tremor, incoordination, spasms, paresthesia or pain Psychiatric: Denies confusion, memory loss or sensory loss. Denies Depression. Endocrine: Denies change in weight, skin, hair change, nocturia, and paresthesia, diabetic polys, visual blurring or hyper / hypo glycemic episodes.  Heme/Lymph: No excessive bleeding, bruising or enlarged lymph nodes.  Physical Exam  BP (!) 140/98   Pulse 66   Temp (!) 97.5 F (36.4 C)   Ht 6\' 1"  (1.854 m)   Wt 223 lb 12.8 oz (101.5 kg)   PF 95 L/min   BMI 29.53 kg/m   General Appearance: Well nourished and well groomed and in no apparent distress.  Eyes: PERRLA, EOMs, conjunctiva no swelling or erythema, normal fundi and vessels. Sinuses: No frontal/maxillary tenderness ENT/Mouth: EACs patent / TMs  nl. Nares clear without erythema, swelling, mucoid exudates. Oral hygiene is good. No erythema, swelling, or exudate. Tongue normal, non-obstructing. Tonsils not swollen or erythematous. Hearing normal.  Neck: Supple, thyroid normal. No bruits, nodes or JVD. Respiratory: Respiratory effort normal.  BS equal and clear bilateral without rales, rhonci, wheezing or stridor. Cardio: Heart sounds are normal with regular rate and rhythm and no murmurs, rubs or gallops. Peripheral pulses are normal and equal bilaterally without edema. No  aortic or femoral bruits. Chest: symmetric with normal excursions and percussion.  Abdomen: Soft, with Nl bowel sounds. Nontender, no guarding, rebound, hernias, masses, or organomegaly.  Lymphatics: Non tender without lymphadenopathy.  Genitourinary: No hernias.Testes nl. DRE - prostate nl for age - smooth & firm w/o nodules. Musculoskeletal: Full ROM all peripheral extremities, joint stability, 5/5 strength, and normal gait. Skin: Warm and dry without rashes, lesions, cyanosis, clubbing or  ecchymosis.  Neuro: Cranial nerves intact, reflexes equal bilaterally. Normal muscle tone, no cerebellar symptoms. Sensation intact.  Pysch: Alert and oriented X 3 with normal affect, insight and judgment appropriate.   Assessment and Plan  1. Annual Preventative/Screening Exam   2. Essential hypertension  - EKG 12-Lead - Urinalysis, Routine w reflex microscopic - Microalbumin / creatinine urine ratio - CBC with Differential/Platelet - BASIC METABOLIC PANEL WITH GFR - Magnesium - TSH  - advised increase Ziac-5 to 2 tabs qd til complete the start new Rx for Ziac-10 - bisoprolol-hydrochlorothiazide (ZIAC) 10-6.25 MG tablet; Take 1 tablet daily for BP  Dispense: 90 tablet; Refill: 1  3. Hyperlipidemia, mixed  - EKG 12-Lead - Hepatic function panel - Lipid panel - TSH  4. Abnormal glucose  - Hemoglobin A1c - Insulin, random  5. Vitamin D deficiency  - VITAMIN D 25 Hydroxy   6. Dysthymia  - buPROPion (WELLBUTRIN XL) 300 MG 24 hr tablet; Take 1 tablet daily for Mood  Dispense: 90 tablet; Refill: 1  - In transition after starting Wellbutrin, patient was advised to continue Trintillix for 10  More days , then d/c.  7. Screening for colorectal cancer  - POC Hemoccult Bld/Stl  8. Prostate cancer screening  - PSA  9. Screening for ischemic heart disease  - EKG 12-Lead  10. Fatigue - Iron,Total/Total Iron Binding Cap - Vitamin B12 - Testosterone - TSH  11. Medication  management  - Urinalysis, Routine w reflex microscopic - Microalbumin / creatinine urine ratio - CBC with Differential/Platelet - BASIC METABOLIC PANEL WITH GFR - Hepatic function panel - Magnesium - Lipid panel - TSH - Hemoglobin A1c - Insulin, random - VITAMIN D 25 Hydroxy         Patient was counseled in prudent diet, weight control to achieve/maintain BMI less than 25, BP monitoring, regular exercise and medications as discussed.  Discussed med effects and SE's. Routine screening labs and tests as requested with regular follow-up as recommended. Over 40 minutes of exam, counseling, chart review and high complex critical decision making was performed

## 2017-10-27 ENCOUNTER — Encounter: Payer: Self-pay | Admitting: Internal Medicine

## 2017-10-27 ENCOUNTER — Ambulatory Visit: Payer: 59 | Admitting: Internal Medicine

## 2017-10-27 VITALS — BP 140/98 | HR 66 | Temp 97.5°F | Ht 73.0 in | Wt 223.8 lb

## 2017-10-27 DIAGNOSIS — E559 Vitamin D deficiency, unspecified: Secondary | ICD-10-CM

## 2017-10-27 DIAGNOSIS — Z136 Encounter for screening for cardiovascular disorders: Secondary | ICD-10-CM | POA: Diagnosis not present

## 2017-10-27 DIAGNOSIS — I10 Essential (primary) hypertension: Secondary | ICD-10-CM

## 2017-10-27 DIAGNOSIS — Z0000 Encounter for general adult medical examination without abnormal findings: Secondary | ICD-10-CM

## 2017-10-27 DIAGNOSIS — R7309 Other abnormal glucose: Secondary | ICD-10-CM | POA: Diagnosis not present

## 2017-10-27 DIAGNOSIS — Z1211 Encounter for screening for malignant neoplasm of colon: Secondary | ICD-10-CM

## 2017-10-27 DIAGNOSIS — Z125 Encounter for screening for malignant neoplasm of prostate: Secondary | ICD-10-CM | POA: Diagnosis not present

## 2017-10-27 DIAGNOSIS — E782 Mixed hyperlipidemia: Secondary | ICD-10-CM | POA: Diagnosis not present

## 2017-10-27 DIAGNOSIS — Z79899 Other long term (current) drug therapy: Secondary | ICD-10-CM

## 2017-10-27 DIAGNOSIS — Z1212 Encounter for screening for malignant neoplasm of rectum: Secondary | ICD-10-CM

## 2017-10-27 DIAGNOSIS — Z0001 Encounter for general adult medical examination with abnormal findings: Secondary | ICD-10-CM

## 2017-10-27 DIAGNOSIS — R5383 Other fatigue: Secondary | ICD-10-CM

## 2017-10-27 DIAGNOSIS — F341 Dysthymic disorder: Secondary | ICD-10-CM

## 2017-10-27 MED ORDER — BISOPROLOL-HYDROCHLOROTHIAZIDE 10-6.25 MG PO TABS: ORAL_TABLET | ORAL | 1 refills | Status: DC

## 2017-10-27 MED ORDER — BUPROPION HCL ER (XL) 300 MG PO TB24: ORAL_TABLET | ORAL | 1 refills | Status: DC

## 2017-10-28 ENCOUNTER — Other Ambulatory Visit: Payer: Self-pay | Admitting: Internal Medicine

## 2017-10-28 DIAGNOSIS — E782 Mixed hyperlipidemia: Secondary | ICD-10-CM

## 2017-10-28 LAB — CBC WITH DIFFERENTIAL/PLATELET
BASOS ABS: 113 {cells}/uL (ref 0–200)
Basophils Relative: 1.4 %
EOS ABS: 211 {cells}/uL (ref 15–500)
EOS PCT: 2.6 %
HCT: 40.7 % (ref 38.5–50.0)
HEMOGLOBIN: 14.2 g/dL (ref 13.2–17.1)
Lymphs Abs: 2592 cells/uL (ref 850–3900)
MCH: 29.8 pg (ref 27.0–33.0)
MCHC: 34.9 g/dL (ref 32.0–36.0)
MCV: 85.5 fL (ref 80.0–100.0)
MONOS PCT: 9.2 %
MPV: 9.7 fL (ref 7.5–12.5)
Neutro Abs: 4439 cells/uL (ref 1500–7800)
Neutrophils Relative %: 54.8 %
PLATELETS: 377 10*3/uL (ref 140–400)
RBC: 4.76 10*6/uL (ref 4.20–5.80)
RDW: 12.6 % (ref 11.0–15.0)
TOTAL LYMPHOCYTE: 32 %
WBC mixed population: 745 cells/uL (ref 200–950)
WBC: 8.1 10*3/uL (ref 3.8–10.8)

## 2017-10-28 LAB — LIPID PANEL
CHOL/HDL RATIO: 8.2 (calc) — AB (ref <5.0)
Cholesterol: 247 mg/dL — ABNORMAL HIGH (ref <200)
HDL: 30 mg/dL — ABNORMAL LOW (ref >40)
NON-HDL CHOLESTEROL (CALC): 217 mg/dL — AB (ref <130)
Triglycerides: 586 mg/dL — ABNORMAL HIGH (ref <150)

## 2017-10-28 LAB — URINALYSIS, ROUTINE W REFLEX MICROSCOPIC
BILIRUBIN URINE: NEGATIVE
Glucose, UA: NEGATIVE
Hgb urine dipstick: NEGATIVE
Ketones, ur: NEGATIVE
LEUKOCYTES UA: NEGATIVE
Nitrite: NEGATIVE
PROTEIN: NEGATIVE
SPECIFIC GRAVITY, URINE: 1.016 (ref 1.001–1.03)
pH: 6.5 (ref 5.0–8.0)

## 2017-10-28 LAB — HEPATIC FUNCTION PANEL
AG RATIO: 1.9 (calc) (ref 1.0–2.5)
ALBUMIN MSPROF: 4.6 g/dL (ref 3.6–5.1)
ALT: 61 U/L — AB (ref 9–46)
AST: 39 U/L (ref 10–40)
Alkaline phosphatase (APISO): 74 U/L (ref 40–115)
BILIRUBIN DIRECT: 0.1 mg/dL (ref 0.0–0.2)
BILIRUBIN TOTAL: 0.3 mg/dL (ref 0.2–1.2)
GLOBULIN: 2.4 g/dL (ref 1.9–3.7)
Indirect Bilirubin: 0.2 mg/dL (calc) (ref 0.2–1.2)
Total Protein: 7 g/dL (ref 6.1–8.1)

## 2017-10-28 LAB — BASIC METABOLIC PANEL WITH GFR
BUN: 14 mg/dL (ref 7–25)
CALCIUM: 9.6 mg/dL (ref 8.6–10.3)
CO2: 26 mmol/L (ref 20–32)
CREATININE: 1.12 mg/dL (ref 0.60–1.35)
Chloride: 102 mmol/L (ref 98–110)
GFR, EST AFRICAN AMERICAN: 91 mL/min/{1.73_m2} (ref >=60)
GFR, EST NON AFRICAN AMERICAN: 79 mL/min/{1.73_m2} (ref >=60)
Glucose, Bld: 100 mg/dL — ABNORMAL HIGH (ref 65–99)
POTASSIUM: 3.9 mmol/L (ref 3.5–5.3)
Sodium: 139 mmol/L (ref 135–146)

## 2017-10-28 LAB — HEMOGLOBIN A1C
HEMOGLOBIN A1C: 5.7 %{Hb} — AB (ref <5.7)
Mean Plasma Glucose: 117 (calc)
eAG (mmol/L): 6.5 (calc)

## 2017-10-28 LAB — INSULIN, RANDOM: Insulin: 24.9 u[IU]/mL — ABNORMAL HIGH (ref 2.0–19.6)

## 2017-10-28 LAB — IRON, TOTAL/TOTAL IRON BINDING CAP
%SAT: 24 % (calc) (ref 15–60)
IRON: 87 ug/dL (ref 50–180)
TIBC: 366 ug/dL (ref 250–425)

## 2017-10-28 LAB — VITAMIN B12: Vitamin B-12: 535 pg/mL (ref 200–1100)

## 2017-10-28 LAB — MAGNESIUM: Magnesium: 1.9 mg/dL (ref 1.5–2.5)

## 2017-10-28 LAB — TESTOSTERONE: Testosterone: 272 ng/dL (ref 250–827)

## 2017-10-28 LAB — MICROALBUMIN / CREATININE URINE RATIO
CREATININE, URINE: 96 mg/dL (ref 20–320)
MICROALB UR: 0.4 mg/dL
Microalb Creat Ratio: 4 mcg/mg creat (ref <30)

## 2017-10-28 LAB — PSA: PSA: 0.5 ng/mL (ref <=4.0)

## 2017-10-28 LAB — TSH: TSH: 0.44 m[IU]/L (ref 0.40–4.50)

## 2017-10-28 LAB — VITAMIN D 25 HYDROXY (VIT D DEFICIENCY, FRACTURES): Vit D, 25-Hydroxy: 68 ng/mL (ref 30–100)

## 2017-10-28 MED ORDER — ROSUVASTATIN CALCIUM 40 MG PO TABS: ORAL_TABLET | ORAL | 1 refills | Status: DC

## 2018-01-13 ENCOUNTER — Ambulatory Visit (INDEPENDENT_AMBULATORY_CARE_PROVIDER_SITE_OTHER): Payer: Self-pay | Admitting: Adult Health

## 2018-01-13 ENCOUNTER — Encounter: Payer: Self-pay | Admitting: Adult Health

## 2018-01-13 VITALS — BP 142/106 | HR 75 | Temp 97.3°F | Ht 73.0 in | Wt 213.0 lb

## 2018-01-13 DIAGNOSIS — I10 Essential (primary) hypertension: Secondary | ICD-10-CM

## 2018-01-13 DIAGNOSIS — Z79899 Other long term (current) drug therapy: Secondary | ICD-10-CM | POA: Diagnosis not present

## 2018-01-13 MED ORDER — OLMESARTAN MEDOXOMIL 40 MG PO TABS: 40.0000 mg | ORAL_TABLET | Freq: Every day | ORAL | 1 refills | Status: DC

## 2018-01-13 NOTE — Progress Notes (Addendum)
Assessment and Plan:  Joshua Rivers was seen today for blood pressure check.  Diagnoses and all orders for this visit:  Essential hypertension Start olmesartan 20 mg daily Monitor blood pressure at home; call if consistently over 130/80 Continue DASH diet.   Reminder to go to the ER if any CP, SOB, nausea, dizziness, severe HA, changes vision/speech, left arm numbness and tingling and jaw pain. Follow up NV for BP recheck in 1 week, 6 weeks for lab only BMP/GFR -     olmesartan (BENICAR) 40 MG tablet; Take 1 tablet (40 mg total) by mouth daily. Take 1/2- 1 tab by mouth daily for goal BP <130/80  Further disposition pending results of labs. Discussed med's effects and SE's.   Over 15 minutes of exam, counseling, chart review, and critical decision making was performed.   Future Appointments  Date Time Provider Department Center  01/28/2018  3:30 PM Judd Gaudierorbett, Cydne Grahn, NP [facility] None  05/04/2018  3:30 PM Lucky CowboyMcKeown, William, MD [facility] None  11/08/2018  2:00 PM Lucky CowboyMcKeown, William, MD [facility] None    ------------------------------------------------------------------------------------------------------------------  HPI BP (!) 142/106   Pulse 75   Temp (!) 97.3 F (36.3 C)   Ht 6\' 1"  (1.854 m)   Wt 213 lb (96.6 kg)   SpO2 97%   BMI 28.10 kg/m   47 y.o.male with hx of htn circa 2017 presents for concerns of elevated BP readings 150/100 or thereabouts consistently; 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 Today in office is 142/106 (similar by provider manual recheck)  He is currently treated by bisoprolol-hctz 10-6.25 mg daily and reports compliance with this. He denies headaches, vision changes, chest pain, palpitations, dizziness.   Baseline renal function from 10/28/17 - GFR 79, Cr 1.12, BUN 14. Former smoker, quit last year and doing well with continued cessation.  Past Medical History:  Diagnosis Date  . Allergy   . GERD  (gastroesophageal reflux disease)    takes prilosec every day  . HTN (hypertension) 08/20/2016  . Kidney stones    last stone was in 2010     No Known Allergies  Current Outpatient Medications on File Prior to Visit  Medication Sig  . ALPRAZolam (XANAX) 0.5 MG tablet TAKE 1/2 TO 1 TABLET BY MOUTH 1 TO 2 TIMES DAILY AS NEEDED FOR ANXIETY  . bisoprolol-hydrochlorothiazide (ZIAC) 10-6.25 MG tablet Take 1 tablet daily for BP  . buPROPion (WELLBUTRIN XL) 300 MG 24 hr tablet Take 1 tablet daily for Mood  . rosuvastatin (CRESTOR) 40 MG tablet Take 1/2 to 1 tablet daily or as directed for Cholesterol  . vortioxetine HBr (TRINTELLIX) 10 MG TABS Take 1 tablet (10 mg total) by mouth daily. (Patient not taking: Reported on 01/13/2018)   No current facility-administered medications on file prior to visit.     ROS: all negative except above.   Physical Exam:  BP (!) 142/106   Pulse 75   Temp (!) 97.3 F (36.3 C)   Ht 6\' 1"  (1.854 m)   Wt 213 lb (96.6 kg)   SpO2 97%   BMI 28.10 kg/m   General Appearance: Well nourished, in no apparent distress. Eyes: PERRLA, EOMs, conjunctiva no swelling or erythema  Neck: Supple.  Respiratory: Respiratory effort normal, BS equal bilaterally without rales, rhonchi, wheezing or stridor.  Cardio: RRR with no MRGs. Brisk peripheral pulses without edema.  Musculoskeletal: normal gait.  Skin: Warm, dry without rashes, lesions, ecchymosis.  Neuro: Cranial  nerves intact. Normal muscle tone, no cerebellar symptoms. Sensation intact.  Psych: Awake and oriented X 3, normal affect, Insight and Judgment appropriate.     Dan Maker, NP 4:20 PM [facility]

## 2018-01-13 NOTE — Patient Instructions (Signed)
Monitor your blood pressure at home, please keep a record and bring that in with you to your next office visit.   Go to the ER if any CP, SOB, nausea, dizziness, severe HA, changes vision/speech  Due to a recent study, SPRINT, we have changed our goal for the systolic or top blood pressure number. Ideally we want your top number at 120.  In the [facility], 5000 people were randomized to a goal BP of 120 and 5000 people were randomized to a goal BP of less than 140. The patients with the goal BP at 120 had LESS DEMENTIA, LESS HEART ATTACKS, AND LESS STROKES, AS WELL AS OVERALL DECREASED MORTALITY OR DEATH RATE.   If you are willing, our goal BP is the top number of 120.  Your most recent BP: BP: (!) 142/106   Take your medications faithfully as instructed. Maintain a healthy weight. Get at least 150 minutes of aerobic exercise per week. Minimize salt intake. Minimize alcohol intake  DASH Eating Plan DASH stands for "Dietary Approaches to Stop Hypertension." The DASH eating plan is a healthy eating plan that has been shown to reduce high blood pressure (hypertension). Additional health benefits may include reducing the risk of type 2 diabetes mellitus, heart disease, and stroke. The DASH eating plan may also help with weight loss. WHAT DO I NEED TO KNOW ABOUT THE DASH EATING PLAN? For the DASH eating plan, you will follow these general guidelines:  Choose foods with a percent daily value for sodium of less than 5% (as listed on the food label).  Use salt-free seasonings or herbs instead of table salt or sea salt.  Check with your health care provider or pharmacist before using salt substitutes.  Eat lower-sodium products, often labeled as "lower sodium" or "no salt added."  Eat fresh foods.  Eat more vegetables, fruits, and low-fat dairy products.  Choose whole grains. Look for the word "whole" as the first word in the ingredient list.  Choose fish and skinless chicken or Mal[place]key  more often than red meat. Limit fish, poultry, and meat to 6 oz (170 g) each day.  Limit sweets, desserts, sugars, and sugary drinks.  Choose heart-healthy fats.  Limit cheese to 1 oz (28 g) per day.  Eat more home-cooked food and less restaurant, buffet, and fast food.  Limit fried foods.  Cook foods using methods other than frying.  Limit canned vegetables. If you do use them, rinse them well to decrease the sodium.  When eating at a restaurant, ask that your food be prepared with less salt, or no salt if possible. WHAT FOODS CAN I EAT? Seek help from a dietitian for individual calorie needs. Grains Whole grain or whole wheat bread. Brown rice. Whole grain or whole wheat pasta. Quinoa, bulgur, and whole grain cereals. Low-sodium cereals. Corn or whole wheat flour tortillas. Whole grain cornbread. Whole grain crackers. Low-sodium crackers. Vegetables Fresh or frozen vegetables (raw, steamed, roasted, or grilled). Low-sodium or reduced-sodium tomato and vegetable juices. Low-sodium or reduced-sodium tomato sauce and paste. Low-sodium or reduced-sodium canned vegetables.  Fruits All fresh, canned (in natural juice), or frozen fruits. Meat and Other Protein Products Ground beef (85% or leaner), grass-fed beef, or beef trimmed of fat. Skinless chicken or [place]key. Ground chicken or [place]urkey. Pork trimmed of fat. All fish and seafood. Eggs. Dried beans, peas, or lentils. Unsalted nuts and seeds. Unsalted canned beans. Dairy Low-fat dairy products, such as skim or 1% milk, 2% or reduced-fat cheeses, low-fat ricotta  or cottage cheese, or plain low-fat yogurt. Low-sodium or reduced-sodium cheeses. Fats and Oils Tub margarines without trans fats. Light or reduced-fat mayonnaise and salad dressings (reduced sodium). Avocado. Safflower, olive, or canola oils. Natural peanut or almond butter. Other Unsalted popcorn and pretzels. The items listed above may not be a complete list of recommended foods  or beverages. Contact your dietitian for more options. WHAT FOODS ARE NOT RECOMMENDED? Grains White bread. White pasta. White rice. Refined cornbread. Bagels and croissants. Crackers that contain trans fat. Vegetables Creamed or fried vegetables. Vegetables in a cheese sauce. Regular canned vegetables. Regular canned tomato sauce and paste. Regular tomato and vegetable juices. Fruits Dried fruits. Canned fruit in light or heavy syrup. Fruit juice. Meat and Other Protein Products Fatty cuts of meat. Ribs, chicken wings, bacon, sausage, bologna, salami, chitterlings, fatback, hot dogs, bratwurst, and packaged luncheon meats. Salted nuts and seeds. Canned beans with salt. Dairy Whole or 2% milk, cream, half-and-half, and cream cheese. Whole-fat or sweetened yogurt. Full-fat cheeses or blue cheese. Nondairy creamers and whipped toppings. Processed cheese, cheese spreads, or cheese curds. Condiments Onion and garlic salt, seasoned salt, table salt, and sea salt. Canned and packaged gravies. Worcestershire sauce. Tartar sauce. Barbecue sauce. Teriyaki sauce. Soy sauce, including reduced sodium. Steak sauce. Fish sauce. Oyster sauce. Cocktail sauce. Horseradish. Ketchup and mustard. Meat flavorings and tenderizers. Bouillon cubes. Hot sauce. Tabasco sauce. Marinades. Taco seasonings. Relishes. Fats and Oils Butter, stick margarine, lard, shortening, ghee, and bacon fat. Coconut, palm kernel, or palm oils. Regular salad dressings. Other Pickles and olives. Salted popcorn and pretzels. The items listed above may not be a complete list of foods and beverages to avoid. Contact your dietitian for more information. WHERE CAN I FIND MORE INFORMATION? National Heart, Lung, and Blood Institute: travelstabloid.com Document Released: 09/25/2011 Document Revised: 02/20/2014 Document Reviewed: 08/10/2013 [facility] Patient Information 2015 [place], [place]. This information is not  intended to replace advice given to you by your health care provider. Make sure you discuss any questions you have with your health care provider.

## 2018-01-13 NOTE — Addendum Note (Signed)
Addended by: Dan MakerORBETT, Lalitha Ilyas C on: 01/13/2018 04:32 PM   Modules accepted: Orders

## 2018-01-20 ENCOUNTER — Other Ambulatory Visit: Payer: Self-pay

## 2018-01-20 DIAGNOSIS — I10 Essential (primary) hypertension: Secondary | ICD-10-CM

## 2018-01-20 MED ORDER — AMLODIPINE BESYLATE 10 MG PO TABS: ORAL_TABLET | ORAL | 1 refills | Status: DC

## 2018-01-27 ENCOUNTER — Encounter: Payer: Self-pay | Admitting: Adult Health

## 2018-01-27 DIAGNOSIS — E782 Mixed hyperlipidemia: Secondary | ICD-10-CM | POA: Insufficient documentation

## 2018-01-27 DIAGNOSIS — E785 Hyperlipidemia, unspecified: Secondary | ICD-10-CM

## 2018-01-27 DIAGNOSIS — R7303 Prediabetes: Secondary | ICD-10-CM | POA: Insufficient documentation

## 2018-01-27 DIAGNOSIS — E663 Overweight: Secondary | ICD-10-CM | POA: Insufficient documentation

## 2018-01-27 HISTORY — DX: Hyperlipidemia, unspecified: E78.5

## 2018-01-27 NOTE — Progress Notes (Signed)
FOLLOW UP  Assessment and Plan:   Depression/anxiety Continue medications; reminded to avoid daily xanax use Lifestyle discussed: diet/exerise, sleep hygiene, stress management, hydration  Hypertension At goal; continue medications Monitor blood pressure at home; call if consistently over 130/80 Continue DASH diet.   Reminder to go to the ER if any CP, SOB, nausea, dizziness, severe HA, changes vision/speech, left arm numbness and tingling and jaw pain.  Cholesterol Currently above goal; continue crestor; diet discussed and possible need for omega 3/ fenofibrate pending lab results Continue low cholesterol diet and exercise.  Check lipid panel.   Prediabetes Continue diet and exercise.  Perform daily foot/skin check, notify office of any concerning changes.  Check A1C  Overweight Long discussion about weight loss, diet, and exercise Recommended diet heavy in fruits and veggies and low in animal meats, cheeses, and dairy products, appropriate calorie intake Discussed ideal weight for height  Will follow up in 3 months  Vitamin D Def At goal at last visit; continue supplementation to maintain goal of 70-100 Defer Vit D level  Continue diet and meds as discussed. Further disposition pending results of labs. Discussed med's effects and SE's.   Over 30 minutes of exam, counseling, chart review, and critical decision making was performed.   Future Appointments  Date Time Provider Department Center  02/24/2018  9:30 AM [facility] NURSE [facility] None  05/04/2018  3:30 PM Lucky Cowboy, MD [facility] None  11/08/2018  2:00 PM Lucky Cowboy, MD [facility] None    ----------------------------------------------------------------------------------------------------------------------  HPI 47 y.o. male  presents for 3 month follow up on hypertension, cholesterol, prediabetes, weight, anxiety and vitamin D deficiency.   he has a diagnosis of depression/anxiety and is currently  on wellburtin ER 300 mg, xanax 0.25-0.5 mg PRN BID , reports symptoms are well controlled on current regimen. he currently uses xanax 0.25 mg 1-2 on a day, takes 5 days a week or less.   BMI is Body mass index is 28.1 kg/m., he has been working on diet and exercise. Wt Readings from Last 3 Encounters:  01/28/18 213 lb (96.6 kg)  01/13/18 213 lb (96.6 kg)  10/27/17 223 lb 12.8 oz (101.5 kg)   His blood pressure has been controlled at home, today their BP is BP: 116/82  He does workout. He denies chest pain, shortness of breath, dizziness.   He is on cholesterol medication (rosuvastatin 20 mg daily) and denies myalgias. His cholesterol is not at goal. The cholesterol last visit was:   Lab Results  Component Value Date   CHOL 247 (H) 10/27/2017   HDL 30 (L) 10/27/2017   LDLCALC  10/27/2017     Comment:     . LDL cholesterol not calculated. Triglyceride levels greater than 400 mg/dL invalidate calculated LDL results. . Reference range: <100 . Desirable range <100 mg/dL for primary prevention;   <70 mg/dL for patients with CHD or diabetic patients  with > or = 2 CHD risk factors. Marland Kitchen LDL-C is now calculated using the Martin-Hopkins  calculation, which is a validated novel method providing  better accuracy than the Friedewald equation in the  estimation of LDL-C.  Horald Pollen et al. Lenox Ahr. 0102;725(36): 2061-2068  (http://education.[facility].com/faq/FAQ164)    TRIG 586 (H) 10/27/2017   CHOLHDL 8.2 (H) 10/27/2017    He has been working on diet and exercise for prediabetes, and denies foot ulcerations, hyperglycemia, increased appetite, nausea, paresthesia of the feet, polydipsia, polyuria, visual disturbances, vomiting and weight loss. Last A1C in the office was:  Lab  Results  Component Value Date   HGBA1C 5.7 (H) 10/27/2017   Patient is on Vitamin D supplement and at goal at recent check:    Lab Results  Component Value Date   VD25OH 68 10/27/2017        Current  Medications:  Current Outpatient Medications on File Prior to Visit  Medication Sig  . ALPRAZolam (XANAX) 0.5 MG tablet TAKE 1/2 TO 1 TABLET BY MOUTH 1 TO 2 TIMES DAILY AS NEEDED FOR ANXIETY  . amLODipine (NORVASC) 10 MG tablet Take 1/2 -1 tablet by mouth daily.  . bisoprolol-hydrochlorothiazide (ZIAC) 10-6.25 MG tablet Take 1 tablet daily for BP  . buPROPion (WELLBUTRIN XL) 300 MG 24 hr tablet Take 1 tablet daily for Mood  . olmesartan (BENICAR) 40 MG tablet Take 1 tablet (40 mg total) by mouth daily. Take 1/2- 1 tab by mouth daily for goal BP <130/80  . rosuvastatin (CRESTOR) 40 MG tablet Take 1/2 to 1 tablet daily or as directed for Cholesterol  . vortioxetine HBr (TRINTELLIX) 10 MG TABS Take 1 tablet (10 mg total) by mouth daily. (Patient not taking: Reported on 01/13/2018)   No current facility-administered medications on file prior to visit.      Allergies: No Known Allergies   Medical History:  Past Medical History:  Diagnosis Date  . Allergy   . GERD (gastroesophageal reflux disease)    takes prilosec every day  . HTN (hypertension) 08/20/2016  . Hyperlipidemia 01/27/2018  . Kidney stones    last stone was in 2010   Family history- Reviewed and unchanged Social history- Reviewed and unchanged   Review of Systems:  Review of Systems  Constitutional: Negative for malaise/fatigue and weight loss.  HENT: Negative for hearing loss and tinnitus.   Eyes: Negative for blurred vision and double vision.  Respiratory: Negative for cough, shortness of breath and wheezing.   Cardiovascular: Negative for chest pain, palpitations, orthopnea, claudication and leg swelling.  Gastrointestinal: Negative for abdominal pain, blood in stool, constipation, diarrhea, heartburn, melena, nausea and vomiting.  Genitourinary: Negative.   Musculoskeletal: Negative for joint pain and myalgias.  Skin: Negative for rash.  Neurological: Negative for dizziness, tingling, sensory change, weakness and  headaches.  Endo/Heme/Allergies: Negative for polydipsia.  Psychiatric/Behavioral: Negative for depression, hallucinations, substance abuse and suicidal ideas. The patient is nervous/anxious. The patient does not have insomnia.   All other systems reviewed and are negative.   Physical Exam: BP 116/82   Pulse 74   Temp (!) 97.3 F (36.3 C)   Ht 6\' 1"  (1.854 m)   Wt 213 lb (96.6 kg)   SpO2 97%   BMI 28.10 kg/m  Wt Readings from Last 3 Encounters:  01/28/18 213 lb (96.6 kg)  01/13/18 213 lb (96.6 kg)  10/27/17 223 lb 12.8 oz (101.5 kg)   General Appearance: Well nourished, in no apparent distress. Eyes: PERRLA, EOMs, conjunctiva no swelling or erythema Sinuses: No Frontal/maxillary tenderness ENT/Mouth: Ext aud canals clear, TMs without erythema, bulging. No erythema, swelling, or exudate on post pharynx.  Tonsils not swollen or erythematous. Hearing normal.  Neck: Supple, thyroid normal.  Respiratory: Respiratory effort normal, BS equal bilaterally without rales, rhonchi, wheezing or stridor.  Cardio: RRR with no MRGs. Brisk peripheral pulses without edema.  Abdomen: Soft, + BS.  Non tender, no guarding, rebound, hernias, masses. Lymphatics: Non tender without lymphadenopathy.  Musculoskeletal: Full ROM, 5/5 strength, Normal gait Skin: Warm, dry without rashes, lesions, ecchymosis.  Neuro: Cranial nerves intact. No cerebellar symptoms.  Psych: Awake and oriented X 3, normal affect, Insight and Judgment appropriate.    Izora Ribas, NP 3:37 PM [facility]

## 2018-01-28 ENCOUNTER — Ambulatory Visit (INDEPENDENT_AMBULATORY_CARE_PROVIDER_SITE_OTHER): Payer: Self-pay | Admitting: Adult Health

## 2018-01-28 ENCOUNTER — Encounter: Payer: Self-pay | Admitting: Adult Health

## 2018-01-28 VITALS — BP 116/82 | HR 74 | Temp 97.3°F | Ht 73.0 in | Wt 213.0 lb

## 2018-01-28 DIAGNOSIS — R7303 Prediabetes: Secondary | ICD-10-CM | POA: Diagnosis not present

## 2018-01-28 DIAGNOSIS — F419 Anxiety disorder, unspecified: Secondary | ICD-10-CM

## 2018-01-28 DIAGNOSIS — I10 Essential (primary) hypertension: Secondary | ICD-10-CM

## 2018-01-28 DIAGNOSIS — F329 Major depressive disorder, single episode, unspecified: Secondary | ICD-10-CM

## 2018-01-28 DIAGNOSIS — E782 Mixed hyperlipidemia: Secondary | ICD-10-CM | POA: Diagnosis not present

## 2018-01-28 DIAGNOSIS — E663 Overweight: Secondary | ICD-10-CM | POA: Diagnosis not present

## 2018-01-28 DIAGNOSIS — F341 Dysthymic disorder: Secondary | ICD-10-CM

## 2018-01-28 DIAGNOSIS — Z79899 Other long term (current) drug therapy: Secondary | ICD-10-CM

## 2018-01-28 MED ORDER — BUPROPION HCL ER (XL) 300 MG PO TB24: ORAL_TABLET | ORAL | 1 refills | Status: DC

## 2018-01-28 MED ORDER — AMLODIPINE BESYLATE 10 MG PO TABS: ORAL_TABLET | ORAL | 1 refills | Status: DC

## 2018-01-28 MED ORDER — BISOPROLOL-HYDROCHLOROTHIAZIDE 10-6.25 MG PO TABS: ORAL_TABLET | ORAL | 1 refills | Status: DC

## 2018-01-28 MED ORDER — OLMESARTAN MEDOXOMIL 40 MG PO TABS: 40.0000 mg | ORAL_TABLET | Freq: Every day | ORAL | 1 refills | Status: DC

## 2018-01-28 MED ORDER — ROSUVASTATIN CALCIUM 40 MG PO TABS: ORAL_TABLET | ORAL | 1 refills | Status: DC

## 2018-01-28 MED ORDER — ALPRAZOLAM 0.5 MG PO TABS: ORAL_TABLET | ORAL | 2 refills | Status: DC

## 2018-01-28 NOTE — Patient Instructions (Signed)
Blood pressure goal <130/80  Cholesterol goals: total <200, LDL <100, triglycerides <150   Preventing High Cholesterol Cholesterol is a waxy, fat-like substance that your body needs in small amounts. Your liver makes all the cholesterol that your body needs. Having high cholesterol (hypercholesterolemia) increases your risk for heart disease and stroke. Extra (excess) cholesterol comes from the food you eat, such as animal-based fat (saturated fat) from meat and some dairy products. High cholesterol can often be prevented with diet and lifestyle changes. If you already have high cholesterol, you can control it with diet and lifestyle changes, as well as medicine. What nutrition changes can be made?  Eat less saturated fat. Foods that contain saturated fat include red meat and some dairy products.  Avoid processed meats, like bacon and lunch meats.  Avoid trans fats, which are found in margarine and some baked goods.  Avoid foods and beverages that have added sugars.  Eat more fruits, vegetables, and whole grains.  Choose healthy sources of protein, such as fish, poultry, and nuts.  Choose healthy sources of fat, such as: ? Nuts. ? Vegetable oils, especially olive oil. ? Fish that have healthy fats (omega-3 fatty acids), such as mackerel or salmon. What lifestyle changes can be made?  Lose weight if you are overweight. Losing 5-10 lb (2.3-4.5 kg) can help prevent or control high cholesterol and reduce your risk for diabetes and high blood pressure. Ask your health care provider to help you with a diet and exercise plan to safely lose weight.  Get enough exercise. Do at least 150 minutes of moderate-intensity exercise each week. ? You could do this in short exercise sessions several times a day, or you could do longer exercise sessions a few times a week. For example, you could take a brisk 10-minute walk or bike ride, 3 times a day, for 5 days a week.  Do not smoke. If you need help  quitting, ask your health care provider.  Limit your alcohol intake. If you drink alcohol, limit alcohol intake to no more than 1 drink a day for nonpregnant women and 2 drinks a day for men. One drink equals 12 oz of beer, 5 oz of wine, or 1 oz of hard liquor. Why are these changes important? If you have high cholesterol, deposits (plaques) may build up on the walls of your blood vessels. Plaques make the arteries narrower and stiffer, which can restrict or block blood flow and cause blood clots to form. This greatly increases your risk for heart attack and stroke. Making diet and lifestyle changes can reduce your risk for these life-threatening conditions. What can I do to lower my risk?  Manage your risk factors for high cholesterol. Talk with your health care provider about all of your risk factors and how to lower your risk.  Manage other conditions that you have, such as diabetes or high blood pressure (hypertension).  Have your cholesterol checked at regular intervals.  Keep all follow-up visits as told by your health care provider. This is important. How is this treated? In addition to diet and lifestyle changes, your health care provider may recommend medicines to help lower cholesterol, such as a medicine to reduce the amount of cholesterol made in your liver. You may need medicine if:  Diet and lifestyle changes do not lower your cholesterol enough.  You have high cholesterol and other risk factors for heart disease or stroke.  Take over-the-counter and prescription medicines only as told by your health care provider.  Where to find more information:  American Heart Association: 1122334455www.heart.org/HEARTORG/Conditions/Cholesterol/Cholesterol_UCM_001089_SubHomePage.jsp  National Heart, Lung, and Blood Institute: http://hood.com/www.nhlbi.nih.gov/health/resources/heart/heart-cholesterol-hbc-what-html Summary  High cholesterol increases your risk for heart disease and stroke. By keeping your  cholesterol level low, you can reduce your risk for these conditions.  Diet and lifestyle changes are the most important steps in preventing high cholesterol.  Work with your health care provider to manage your risk factors, and have your blood tested regularly. This information is not intended to replace advice given to you by your health care provider. Make sure you discuss any questions you have with your health care provider. Document Released: 10/21/2015 Document Revised: 06/14/2016 Document Reviewed: 06/14/2016 Elsevier Interactive Patient Education  [employer]2018 Elsevier Inc.

## 2018-01-29 LAB — BASIC METABOLIC PANEL WITH GFR
BUN: 21 mg/dL (ref 7–25)
CO2: 29 mmol/L (ref 20–32)
CREATININE: 1.33 mg/dL (ref 0.60–1.35)
Calcium: 9.7 mg/dL (ref 8.6–10.3)
Chloride: 104 mmol/L (ref 98–110)
GFR, EST NON AFRICAN AMERICAN: 64 mL/min/{1.73_m2} (ref >=60)
GFR, Est African American: 74 mL/min/{1.73_m2} (ref >=60)
GLUCOSE: 86 mg/dL (ref 65–99)
Potassium: 4.1 mmol/L (ref 3.5–5.3)
Sodium: 140 mmol/L (ref 135–146)

## 2018-01-29 LAB — HEMOGLOBIN A1C
HEMOGLOBIN A1C: 5.7 %{Hb} — AB (ref <5.7)
Mean Plasma Glucose: 117 (calc)
eAG (mmol/L): 6.5 (calc)

## 2018-01-29 LAB — LIPID PANEL
CHOL/HDL RATIO: 4 (calc) (ref <5.0)
Cholesterol: 144 mg/dL (ref <200)
HDL: 36 mg/dL — AB (ref >40)
LDL Cholesterol (Calc): 78 mg/dL (calc)
NON-HDL CHOLESTEROL (CALC): 108 mg/dL (ref <130)
TRIGLYCERIDES: 199 mg/dL — AB (ref <150)

## 2018-01-29 LAB — HEPATIC FUNCTION PANEL
AG RATIO: 2.1 (calc) (ref 1.0–2.5)
ALT: 46 U/L (ref 9–46)
AST: 38 U/L (ref 10–40)
Albumin: 4.8 g/dL (ref 3.6–5.1)
Alkaline phosphatase (APISO): 86 U/L (ref 40–115)
BILIRUBIN TOTAL: 0.3 mg/dL (ref 0.2–1.2)
Bilirubin, Direct: 0.1 mg/dL (ref 0.0–0.2)
GLOBULIN: 2.3 g/dL (ref 1.9–3.7)
Indirect Bilirubin: 0.2 mg/dL (calc) (ref 0.2–1.2)
TOTAL PROTEIN: 7.1 g/dL (ref 6.1–8.1)

## 2018-02-24 ENCOUNTER — Ambulatory Visit: Payer: Self-pay

## 2018-05-04 ENCOUNTER — Ambulatory Visit: Payer: Self-pay | Admitting: Internal Medicine

## 2018-05-31 ENCOUNTER — Ambulatory Visit: Payer: Self-pay | Admitting: Internal Medicine

## 2018-05-31 VITALS — BP 126/80 | HR 84 | Temp 97.3°F | Resp 16 | Ht 73.0 in | Wt 216.4 lb

## 2018-05-31 DIAGNOSIS — Z0000 Encounter for general adult medical examination without abnormal findings: Secondary | ICD-10-CM

## 2018-05-31 DIAGNOSIS — I10 Essential (primary) hypertension: Secondary | ICD-10-CM

## 2018-05-31 DIAGNOSIS — Z79899 Other long term (current) drug therapy: Secondary | ICD-10-CM

## 2018-05-31 DIAGNOSIS — R7303 Prediabetes: Secondary | ICD-10-CM

## 2018-05-31 DIAGNOSIS — E559 Vitamin D deficiency, unspecified: Secondary | ICD-10-CM

## 2018-05-31 DIAGNOSIS — E782 Mixed hyperlipidemia: Secondary | ICD-10-CM

## 2018-05-31 NOTE — Patient Instructions (Signed)

## 2018-06-05 ENCOUNTER — Encounter: Payer: Self-pay | Admitting: Internal Medicine

## 2018-06-05 NOTE — Progress Notes (Signed)
This very nice 47 y.o. [name] presents for 6 month follow up with HTN, HLD, Pre-Diabetes and Vitamin D Deficiency.      Patient is treated for HTN (Oct 2017) & BP has been controlled at home. Today's BP is at goal - 126/80. Patient has had no complaints of any cardiac type chest pain, palpitations, dyspnea / orthopnea / PND, dizziness, claudication, or dependent edema.     Hyperlipidemia is controlled with diet & meds. Patient denies myalgias or other med SE's. Last Lipids were at goal albeit elevated Trig's: Lab Results  Component Value Date   CHOL 144 01/28/2018   HDL 36 (L) 01/28/2018   LDLCALC 78 01/28/2018   TRIG 199 (H) 01/28/2018   CHOLHDL 4.0 01/28/2018      Also, the patient has history of PreDiabetes (4/42019) and has had no symptoms of reactive hypoglycemia, diabetic polys, paresthesias or visual blurring.  Last A1c was not at goal: Lab Results  Component Value Date   HGBA1C 5.7 (H) 01/28/2018      Further, the patient also has history of Vitamin D Deficiency ("24"/Sept.2017)  and supplements vitamin D without any suspected side-effects. Last vitamin D was at goal:   Lab Results  Component Value Date   VD25OH 68 10/27/2017   Current Outpatient Medications on File Prior to Visit  Medication Sig  . ALPRAZolam (XANAX) 0.5 MG tablet TAKE 1/2 TO 1 TABLET BY MOUTH 1 TO 2 TIMES DAILY AS NEEDED FOR ANXIETY  . amLODipine (NORVASC) 10 MG tablet Take 1/2 -1 tablet by mouth daily.  . bisoprolol-hydrochlorothiazide (ZIAC) 10-6.25 MG tablet Take 1 tablet daily for BP  . buPROPion (WELLBUTRIN XL) 300 MG 24 hr tablet Take 1 tablet daily for Mood  . olmesartan (BENICAR) 40 MG tablet Take 1 tablet (40 mg total) by mouth daily. Take 1/2- 1 tab by mouth daily for goal BP <130/80  . rosuvastatin (CRESTOR) 40 MG tablet Take 1/2 to 1 tablet daily or as directed for Cholesterol   No current facility-administered medications on file prior to visit.    No Known Allergies   PMHx:   Past  Medical History:  Diagnosis Date  . Allergy   . GERD (gastroesophageal reflux disease)    takes prilosec every day  . HTN (hypertension) 08/20/2016  . Hyperlipidemia 01/27/2018  . Kidney stones    last stone was in 2010    There is no immunization history on file for this patient. Past Surgical History:  Procedure Laterality Date  . ANTERIOR CRUCIATE LIGAMENT REPAIR Left   . CHEST TUBE INSERTION    . LITHOTRIPSY    . WISDOM TOOTH EXTRACTION     FHx:    Reviewed / unchanged  SHx:    Reviewed / unchanged   Systems Review:  Constitutional: Denies fever, chills, wt changes, headaches, insomnia, fatigue, night sweats, change in appetite. Eyes: Denies redness, blurred vision, diplopia, discharge, itchy, watery eyes.  ENT: Denies discharge, congestion, post nasal drip, epistaxis, sore throat, earache, hearing loss, dental pain, tinnitus, vertigo, sinus pain, snoring.  CV: Denies chest pain, palpitations, irregular heartbeat, syncope, dyspnea, diaphoresis, orthopnea, PND, claudication or edema. Respiratory: denies cough, dyspnea, DOE, pleurisy, hoarseness, laryngitis, wheezing.  Gastrointestinal: Denies dysphagia, odynophagia, heartburn, reflux, water brash, abdominal pain or cramps, nausea, vomiting, bloating, diarrhea, constipation, hematemesis, melena, hematochezia  or hemorrhoids. Genitourinary: Denies dysuria, frequency, urgency, nocturia, hesitancy, discharge, hematuria or flank pain. Musculoskeletal: Denies arthralgias, myalgias, stiffness, jt. swelling, pain, limping or strain/sprain.  Skin:  Denies pruritus, rash, hives, warts, acne, eczema or change in skin lesion(s). Neuro: No weakness, tremor, incoordination, spasms, paresthesia or pain. Psychiatric: Denies confusion, memory loss or sensory loss. Endo: Denies change in weight, skin or hair change.  Heme/Lymph: No excessive bleeding, bruising or enlarged lymph nodes.  Physical Exam  BP 126/80   Pulse 84   Temp (!) 97.3 F  (36.3 C)   Resp 16   Ht 6\' 1"  (1.854 m)   Wt 216 lb 6.4 oz (98.2 kg)   BMI 28.55 kg/m   Appears  well nourished, well groomed  and in no distress.  Eyes: PERRLA, EOMs, conjunctiva no swelling or erythema. Sinuses: No frontal/maxillary tenderness ENT/Mouth: EAC's clear, TM's nl w/o erythema, bulging. Nares clear w/o erythema, swelling, exudates. Oropharynx clear without erythema or exudates. Oral hygiene is good. Tongue normal, non obstructing. Hearing intact.  Neck: Supple. Thyroid not palpable. Car 2+/2+ without bruits, nodes or JVD. Chest: Respirations nl with BS clear & equal w/o rales, rhonchi, wheezing or stridor.  Cor: Heart sounds normal w/ regular rate and rhythm without sig. murmurs, gallops, clicks or rubs. Peripheral pulses normal and equal  without edema.  Abdomen: Soft & bowel sounds normal. Non-tender w/o guarding, rebound, hernias, masses or organomegaly.  Lymphatics: Unremarkable.  Musculoskeletal: Full ROM all peripheral extremities, joint stability, 5/5 strength and normal gait.  Skin: Warm, dry without exposed rashes, lesions or ecchymosis apparent.  Neuro: Cranial nerves intact, reflexes equal bilaterally. Sensory-motor testing grossly intact. Tendon reflexes grossly intact.  Pysch: Alert & oriented x 3.  Insight and judgement nl & appropriate. No ideations.  Assessment and Plan:  1. Essential hypertension  - Continue medication, monitor blood pressure at home.  - Continue DASH diet.  Reminder to go to the ER if any CP,  SOB, nausea, dizziness, severe HA, changes vision/speech.   2. Hyperlipidemia, mixed  - Continue diet/meds, exercise,& lifestyle modifications.    3. Prediabetes  - Continue diet, exercise, lifestyle modifications.    4. Vitamin D deficiency  - Continue supplementation.   5. Medication management       Discussed  regular exercise, BP monitoring, weight control to achieve/maintain BMI less than 25 and discussed med and SE's. Patient  deferred labs today due lack of insurance between jobs.Over 30 minutes of exam, counseling, chart review was performed.  (No OV charge today)

## 2018-06-10 ENCOUNTER — Other Ambulatory Visit: Payer: Self-pay | Admitting: Adult Health

## 2018-06-11 ENCOUNTER — Other Ambulatory Visit: Payer: Self-pay | Admitting: Internal Medicine

## 2018-06-11 MED ORDER — ALPRAZOLAM 0.5 MG PO TABS: ORAL_TABLET | ORAL | 0 refills | Status: DC

## 2018-06-17 ENCOUNTER — Other Ambulatory Visit: Payer: Self-pay | Admitting: Adult Health

## 2018-07-19 ENCOUNTER — Other Ambulatory Visit: Payer: Self-pay | Admitting: Adult Health

## 2018-07-19 DIAGNOSIS — I10 Essential (primary) hypertension: Secondary | ICD-10-CM

## 2018-07-27 ENCOUNTER — Other Ambulatory Visit: Payer: Self-pay | Admitting: Adult Health

## 2018-07-27 DIAGNOSIS — F341 Dysthymic disorder: Secondary | ICD-10-CM

## 2018-07-31 ENCOUNTER — Other Ambulatory Visit: Payer: Self-pay | Admitting: Adult Health

## 2018-07-31 DIAGNOSIS — I10 Essential (primary) hypertension: Secondary | ICD-10-CM

## 2018-08-09 ENCOUNTER — Other Ambulatory Visit: Payer: Self-pay | Admitting: Adult Health

## 2018-08-09 DIAGNOSIS — I10 Essential (primary) hypertension: Secondary | ICD-10-CM

## 2018-08-12 ENCOUNTER — Other Ambulatory Visit: Payer: Self-pay | Admitting: Adult Health

## 2018-08-12 DIAGNOSIS — I10 Essential (primary) hypertension: Secondary | ICD-10-CM

## 2018-08-16 ENCOUNTER — Other Ambulatory Visit: Payer: Self-pay | Admitting: Adult Health

## 2018-10-25 ENCOUNTER — Other Ambulatory Visit: Payer: Self-pay | Admitting: Adult Health

## 2018-10-25 DIAGNOSIS — I10 Essential (primary) hypertension: Secondary | ICD-10-CM

## 2018-11-08 ENCOUNTER — Encounter: Payer: Self-pay | Admitting: Internal Medicine

## 2018-11-12 ENCOUNTER — Other Ambulatory Visit: Payer: Self-pay | Admitting: Adult Health

## 2018-11-12 DIAGNOSIS — I10 Essential (primary) hypertension: Secondary | ICD-10-CM

## 2018-11-16 ENCOUNTER — Other Ambulatory Visit: Payer: Self-pay | Admitting: Adult Health

## 2018-11-16 DIAGNOSIS — E782 Mixed hyperlipidemia: Secondary | ICD-10-CM

## 2018-11-23 ENCOUNTER — Other Ambulatory Visit: Payer: Self-pay | Admitting: Internal Medicine

## 2018-11-23 DIAGNOSIS — F419 Anxiety disorder, unspecified: Principal | ICD-10-CM

## 2018-11-23 DIAGNOSIS — F329 Major depressive disorder, single episode, unspecified: Secondary | ICD-10-CM

## 2018-11-23 DIAGNOSIS — F32A Depression, unspecified: Secondary | ICD-10-CM

## 2018-11-23 MED ORDER — ALPRAZOLAM 0.5 MG PO TABS: ORAL_TABLET | ORAL | 0 refills | Status: DC

## 2018-11-27 ENCOUNTER — Other Ambulatory Visit: Payer: Self-pay | Admitting: Internal Medicine

## 2018-11-27 DIAGNOSIS — F419 Anxiety disorder, unspecified: Principal | ICD-10-CM

## 2018-11-27 DIAGNOSIS — F329 Major depressive disorder, single episode, unspecified: Secondary | ICD-10-CM

## 2018-11-27 MED ORDER — ALPRAZOLAM 0.5 MG PO TABS: ORAL_TABLET | ORAL | 0 refills | Status: DC

## 2018-12-03 ENCOUNTER — Encounter: Payer: Self-pay | Admitting: Adult Health Nurse Practitioner

## 2018-12-03 ENCOUNTER — Ambulatory Visit: Payer: Commercial Managed Care - PPO | Admitting: Adult Health Nurse Practitioner

## 2018-12-03 VITALS — BP 110/76 | HR 69 | Temp 97.2°F | Ht 73.0 in | Wt 203.6 lb

## 2018-12-03 DIAGNOSIS — F419 Anxiety disorder, unspecified: Secondary | ICD-10-CM

## 2018-12-03 DIAGNOSIS — Z79899 Other long term (current) drug therapy: Secondary | ICD-10-CM

## 2018-12-03 DIAGNOSIS — F329 Major depressive disorder, single episode, unspecified: Secondary | ICD-10-CM

## 2018-12-03 DIAGNOSIS — E782 Mixed hyperlipidemia: Secondary | ICD-10-CM | POA: Diagnosis not present

## 2018-12-03 DIAGNOSIS — F32A Depression, unspecified: Secondary | ICD-10-CM

## 2018-12-03 DIAGNOSIS — I10 Essential (primary) hypertension: Secondary | ICD-10-CM | POA: Diagnosis not present

## 2018-12-03 MED ORDER — ESCITALOPRAM OXALATE 10 MG PO TABS: 10.0000 mg | ORAL_TABLET | Freq: Every day | ORAL | 2 refills | Status: DC

## 2018-12-03 NOTE — Patient Instructions (Addendum)
Today we are going to start you on Lexapro 10mg  daily.  Take this at night. 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 Follow up in 4-6 on mood.    Generalized Anxiety Disorder, Adult Generalized anxiety disorder (GAD) is a mental health disorder. People with this condition constantly worry about everyday events. Unlike normal anxiety, worry related to GAD is not triggered by a specific event. These worries also do not fade or get better with time. GAD interferes with life functions, including relationships, work, and school. GAD can vary from mild to severe. People with severe GAD can have intense waves of anxiety with physical symptoms (panic attacks). What are the causes? The exact cause of GAD is not known. What increases the risk? This condition is more likely to develop in:  Women.  People who have a family history of anxiety disorders.  People who are very shy.  People who experience very stressful life events, such as the death of a loved one.  People who have a very stressful family environment. What are the signs or symptoms? People with GAD often worry excessively about many things in their lives, such as their health and family. They may also be overly concerned about:  Doing well at work.  Being on time.  Natural disasters.  Friendships. Physical symptoms of GAD include:  Fatigue.  Muscle tension or having muscle twitches.  Trembling or feeling shaky.  Being easily startled.  Feeling like your heart is pounding or racing.  Feeling out of breath or like you cannot take a deep breath.  Having trouble falling asleep or staying asleep.  Sweating.  Nausea, diarrhea, or irritable bowel syndrome (IBS).  Headaches.  Trouble concentrating or remembering facts.  Restlessness.  Irritability. How is this diagnosed? Your health care provider can diagnose GAD based on your symptoms and medical history. You  will also have a physical exam. The health care provider will ask specific questions about your symptoms, including how severe they are, when they started, and if they come and go. Your health care provider may ask you about your use of alcohol or drugs, including prescription medicines. Your health care provider may refer you to a mental health specialist for further evaluation. Your health care provider will do a thorough examination and may perform additional tests to rule out other possible causes of your symptoms. To be diagnosed with GAD, a person must have anxiety that:  Is out of his or her control.  Affects several different aspects of his or her life, such as work and relationships.  Causes distress that makes him or her unable to take part in normal activities.  Includes at least three physical symptoms of GAD, such as restlessness, fatigue, trouble concentrating, irritability, muscle tension, or sleep problems. Before your health care provider can confirm a diagnosis of GAD, these symptoms must be present more days than they are not, and they must last for six months or longer. How is this treated? The following therapies are usually used to treat GAD:  Medicine. Antidepressant medicine is usually prescribed for long-term daily control. Antianxiety medicines may be added in severe cases, especially when panic attacks occur.  Talk therapy (psychotherapy). Certain types of talk therapy can be helpful in treating GAD by providing support, education, and guidance. Options include: ? Cognitive behavioral therapy (CBT). People learn coping skills and techniques to ease their anxiety. They learn to identify unrealistic or negative thoughts and behaviors and to replace  them with positive ones. ? Acceptance and commitment therapy (ACT). This treatment teaches people how to be mindful as a way to cope with unwanted thoughts and feelings. ? Biofeedback. This process trains you to manage your  body's response (physiological response) through breathing techniques and relaxation methods. You will work with a therapist while machines are used to monitor your physical symptoms.  Stress management techniques. These include yoga, meditation, and exercise. A mental health specialist can help determine which treatment is best for you. Some people see improvement with one type of therapy. However, other people require a combination of therapies. Follow these instructions at home:  Take over-the-counter and prescription medicines only as told by your health care provider.  Try to maintain a normal routine.  Try to anticipate stressful situations and allow extra time to manage them.  Practice any stress management or self-calming techniques as taught by your health care provider.  Do not punish yourself for setbacks or for not making progress.  Try to recognize your accomplishments, even if they are small.  Keep all follow-up visits as told by your health care provider. This is important. Contact a health care provider if:  Your symptoms do not get better.  Your symptoms get worse.  You have signs of depression, such as: ? A persistently sad, cranky, or irritable mood. ? Loss of enjoyment in activities that used to bring you joy. ? Change in weight or eating. ? Changes in sleeping habits. ? Avoiding friends or family members. ? Loss of energy for normal tasks. ? Feelings of guilt or worthlessness. Get help right away if:  You have serious thoughts about hurting yourself or others. If you ever feel like you may hurt yourself or others, or have thoughts about taking your own life, get help right away. You can go to your nearest emergency department or call:  Your local emergency services (911 in the [place]  A suicide crisis helpline, such as the National Suicide Prevention Lifeline at 616-837-6814. This is open 24 hours a day. Summary  Generalized anxiety disorder (GAD) is  a mental health disorder that involves worry that is not triggered by a specific event.  People with GAD often worry excessively about many things in their lives, such as their health and family.  GAD may cause physical symptoms such as restlessness, trouble concentrating, sleep problems, frequent sweating, nausea, diarrhea, headaches, and trembling or muscle twitching.  A mental health specialist can help determine which treatment is best for you. Some people see improvement with one type of therapy. However, other people require a combination of therapies. This information is not intended to replace advice given to you by your health care provider. Make sure you discuss any questions you have with your health care provider. Document Released: 01/31/2013 Document Revised: 08/26/2016 Document Reviewed: 08/26/2016 Elsevier Interactive Patient Education  2019 [employer].

## 2018-12-03 NOTE — Progress Notes (Signed)
Assessment and Plan:  Joshua Rivers was seen today for medication management.  Diagnoses and all orders for this visit:  Anxiety and depression Discontinue Alprazolam -Rx   escitalopram (LEXAPRO) 10 MG tablet; Take 1 tablet (10 mg total) by mouth daily. Discussed medication and side effects Continue Wellbutrin XL 300mg  daily   Essential hypertension Controlled on current regiment Continue medications:  Bisoprolol-HCTZ 10-6.25 mg daily in am Olmesartan 40mg  daily Amlodipine 10mg  nightly Monitor blood pressure at home; call if consistently over 130/80 Continue DASH diet.   Reminder to go to the ER if any CP, SOB, nausea, dizziness, severe HA, changes vision/speech, left arm numbness and tingling and jaw pain.  Hyperlipidemia, mixed Doing well on current regiment Continue Crestor 40mg  nightly  Medication management Discussed medications and current regiment with patient  Letter provided to patient stating the Alprazolam has been discontinued.  Patient agrees with this plan.  Follow up in 4-6 weeks  Call or return with new or worsening symptoms as discussed in appointment.  May contact via office phone [phone] or via MyChart.   Further disposition pending results of labs. Discussed med's effects and SE's.   Over 30 minutes of exam, counseling, chart review, and critical decision making was performed.   Future Appointments  Date Time Provider Department Center  12/03/2018  9:00 AM Elder Negus, NP [facility] None  12/20/2018 10:00 AM Lucky Cowboy, MD [facility] None    ------------------------------------------------------------------------------------------------------------------   HPI 48 y.o.male presents for evaluation for anxiety.  Reports that his mother pasted away two years ago and this was very difficult for him.  Recently his father pasted away and funeral in one week.  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  He was taking xanax, half tablet once a day.  The examination did not happen related to this on his medication list. Reports he would not have starting taking it if he knew it would disqualify him. He would like to discuss changing this.  He is currently taking wellbutrin XL 300mg .  Reports this is working well for him.  Reports he has taken zoloft in the past and did ok on this.  He has also tried effexor which he reports multiple side effects and did not like this medicaiton.  He reports that he is sleeping well at night.  He denies and SI/HI.  Past Medical History:  Diagnosis Date  . Allergy   . GERD (gastroesophageal reflux disease)    takes prilosec every day  . HTN (hypertension) 08/20/2016  . Hyperlipidemia 01/27/2018  . Kidney stones    last stone was in 2010     No Known Allergies  Current Outpatient Medications on File Prior to Visit  Medication Sig  . ALPRAZolam (XANAX) 0.5 MG tablet Take 1/2-1 tablet1to 2 x /day ONLY if needed for Anxiety Attack &  limit to 5 days /week to avoid addiction  . amLODipine (NORVASC) 10 MG tablet TAKE 1/2 TO 1 TABLET BY MOUTH DAILY  . bisoprolol-hydrochlorothiazide (ZIAC) 10-6.25 MG tablet TAKE ONE TABLET BY MOUTH DAILY FOR BLOOD PRESSURE  . buPROPion (WELLBUTRIN XL) 300 MG 24 hr tablet TAKE ONE TABLET BY MOUTH DAILY FOR MOOD  . olmesartan (BENICAR) 40 MG tablet TAKE 1/2 TO 1 TABLET BY MOUTH DAILY FOR BLOOD PRESSURE GOAL LESS THAN 130/80  . rosuvastatin (CRESTOR) 40 MG tablet  TAKE ONE-HALF TO ONE TABLET BY MOUTH DAILY AS DIRECTED FOR CHOLESTEROL   No current facility-administered medications on file prior to visit.     ROS: Review of Systems  Constitutional: Negative for chills, diaphoresis, fever, malaise/fatigue and weight loss.  Respiratory: Negative for cough, hemoptysis, sputum production, shortness of breath and wheezing.    Cardiovascular: Negative for chest pain, palpitations, orthopnea, claudication, leg swelling and PND.  Neurological: Negative for dizziness, tingling, tremors, sensory change, speech change, focal weakness, seizures, loss of consciousness, weakness and headaches.  Psychiatric/Behavioral: Negative for depression, hallucinations, memory loss, substance abuse and suicidal ideas. The patient is nervous/anxious. The patient does not have insomnia.      Physical Exam:  There were no vitals taken for this visit.  General Appearance: Well nourished, in no apparent distress. Respiratory: Respiratory effort normal, BS equal bilaterally without rales, rhonchi, wheezing or stridor.  Cardio: RRR with no MRGs. Brisk peripheral pulses without edema.  Skin: Warm, dry without rashes, lesions, ecchymosis.  Neuro: Cranial nerves intact. Normal muscle tone, no cerebellar symptoms. Sensation intact.  Psych: Awake and oriented X 3, normal affect, Insight and Judgment appropriate.     Elder Negus, NP 7:26 AM [facility]

## 2018-12-20 ENCOUNTER — Encounter: Payer: Self-pay | Admitting: Internal Medicine

## 2019-01-16 NOTE — Progress Notes (Addendum)
NO SHOW

## 2019-01-16 NOTE — Patient Instructions (Signed)

## 2019-01-17 ENCOUNTER — Ambulatory Visit: Payer: Self-pay | Admitting: Internal Medicine

## 2019-01-20 ENCOUNTER — Other Ambulatory Visit: Payer: Self-pay | Admitting: Internal Medicine

## 2019-01-20 DIAGNOSIS — F341 Dysthymic disorder: Secondary | ICD-10-CM

## 2019-02-16 ENCOUNTER — Other Ambulatory Visit: Payer: Self-pay | Admitting: Internal Medicine

## 2019-02-16 DIAGNOSIS — I10 Essential (primary) hypertension: Secondary | ICD-10-CM

## 2019-02-28 ENCOUNTER — Other Ambulatory Visit: Payer: Self-pay | Admitting: Adult Health Nurse Practitioner

## 2019-02-28 DIAGNOSIS — F419 Anxiety disorder, unspecified: Secondary | ICD-10-CM

## 2019-02-28 DIAGNOSIS — F329 Major depressive disorder, single episode, unspecified: Secondary | ICD-10-CM

## 2019-03-06 ENCOUNTER — Other Ambulatory Visit: Payer: Self-pay | Admitting: Adult Health Nurse Practitioner

## 2019-03-06 DIAGNOSIS — F419 Anxiety disorder, unspecified: Secondary | ICD-10-CM

## 2019-03-06 DIAGNOSIS — F329 Major depressive disorder, single episode, unspecified: Secondary | ICD-10-CM

## 2019-03-29 ENCOUNTER — Other Ambulatory Visit: Payer: Self-pay | Admitting: Internal Medicine

## 2019-03-29 DIAGNOSIS — E782 Mixed hyperlipidemia: Secondary | ICD-10-CM

## 2019-04-19 ENCOUNTER — Other Ambulatory Visit: Payer: Self-pay | Admitting: Internal Medicine

## 2019-04-19 DIAGNOSIS — F341 Dysthymic disorder: Secondary | ICD-10-CM

## 2019-04-21 ENCOUNTER — Ambulatory Visit (INDEPENDENT_AMBULATORY_CARE_PROVIDER_SITE_OTHER): Payer: Self-pay | Admitting: Internal Medicine

## 2019-04-21 ENCOUNTER — Other Ambulatory Visit: Payer: Self-pay

## 2019-04-21 VITALS — BP 132/76 | HR 76 | Temp 97.6°F | Resp 16 | Ht 73.0 in | Wt 199.0 lb

## 2019-04-21 DIAGNOSIS — R7303 Prediabetes: Secondary | ICD-10-CM

## 2019-04-21 DIAGNOSIS — R7309 Other abnormal glucose: Secondary | ICD-10-CM

## 2019-04-21 DIAGNOSIS — E559 Vitamin D deficiency, unspecified: Secondary | ICD-10-CM

## 2019-04-21 DIAGNOSIS — F32A Depression, unspecified: Secondary | ICD-10-CM

## 2019-04-21 DIAGNOSIS — I10 Essential (primary) hypertension: Secondary | ICD-10-CM

## 2019-04-21 DIAGNOSIS — Z79899 Other long term (current) drug therapy: Secondary | ICD-10-CM

## 2019-04-21 DIAGNOSIS — F329 Major depressive disorder, single episode, unspecified: Secondary | ICD-10-CM

## 2019-04-21 DIAGNOSIS — F419 Anxiety disorder, unspecified: Secondary | ICD-10-CM

## 2019-04-21 DIAGNOSIS — E782 Mixed hyperlipidemia: Secondary | ICD-10-CM

## 2019-04-21 NOTE — Progress Notes (Signed)
History of Present Illness:       This very nice 48 y.o.male presents for 3 month follow up with HTN, HLD, Pre-Diabetes and Vitamin D Deficiency.           Patient is treated for HTN (Oct 2017)  & BP has been controlled at home. Today's BP is at goal - 132/76. Patient has had no complaints of any cardiac type chest pain, palpitations, dyspnea / orthopnea / PND, dizziness, claudication, or dependent edema.       Hyperlipidemia is controlled with diet & meds. Patient denies myalgias or other med SE's. Last Lipids were at goal albeit elevated Trig's: Lab Results  Component Value Date   CHOL 144 01/28/2018   HDL 36 (L) 01/28/2018   LDLCALC 78 01/28/2018   TRIG 199 (H) 01/28/2018   CHOLHDL 4.0 01/28/2018       Also, the patient has history of PreDiabetes (4 / 2019) and has had no symptoms of reactive hypoglycemia, diabetic polys, paresthesias or visual blurring.  Last A1c was near goal: Lab Results  Component Value Date   HGBA1C 5.7 (H) 01/28/2018      Further, the patient also has history of Vitamin D Deficiency ("24" / Sept.2017) and supplements vitamin D without any suspected side-effects. Last vitamin D was at goal: Lab Results  Component Value Date   VD25OH 68 10/27/2017   Current Outpatient Medications on File Prior to Visit  Medication Sig  . amLODipine (NORVASC) 10 MG tablet TAKE 1/2 TO 1 TABLET BY MOUTH DAILY  . bisoprolol-hydrochlorothiazide (ZIAC) 10-6.25 MG tablet Take 1 tablet daily for BP  . buPROPion (WELLBUTRIN XL) 300 MG 24 hr tablet TAKE ONE TABLET BY MOUTH DAILY FOR MOOD  . olmesartan (BENICAR) 40 MG tablet TAKE 1/2 TO 1 TABLET BY MOUTH DAILY FOR BLOOD PRESSURE GOAL LESS THAN 130/80  . rosuvastatin (CRESTOR) 40 MG tablet Take 1/2 to 1  tablet Daily for Cholesterol   No current facility-administered medications on file prior to visit.    No Known Allergies  PMHx:   Past Medical History:  Diagnosis Date  . Allergy   . GERD (gastroesophageal reflux disease)     takes prilosec every day  . HTN (hypertension) 08/20/2016  . Hyperlipidemia 01/27/2018  . Kidney stones    last stone was in 2010   Past Surgical History:  Procedure Laterality Date  . ANTERIOR CRUCIATE LIGAMENT REPAIR Left   . CHEST TUBE INSERTION    . LITHOTRIPSY    . WISDOM TOOTH EXTRACTION     FHx:    Reviewed / unchanged  SHx:    Reviewed / unchanged   Systems Review:  Constitutional: Denies fever, chills, wt changes, headaches, insomnia, fatigue, night sweats, change in appetite. Eyes: Denies redness, blurred vision, diplopia, discharge, itchy, watery eyes.  ENT: Denies discharge, congestion, post nasal drip, epistaxis, sore throat, earache, hearing loss, dental pain, tinnitus, vertigo, sinus pain, snoring.  CV: Denies chest pain, palpitations, irregular heartbeat, syncope, dyspnea, diaphoresis, orthopnea, PND, claudication or edema. Respiratory: denies cough, dyspnea, DOE, pleurisy, hoarseness, laryngitis, wheezing.  Gastrointestinal: Denies dysphagia, odynophagia, heartburn, reflux, water brash, abdominal pain or cramps, nausea, vomiting, bloating, diarrhea, constipation, hematemesis, melena, hematochezia  or hemorrhoids. Genitourinary: Denies dysuria, frequency, urgency, nocturia, hesitancy, discharge, hematuria or flank pain. Musculoskeletal: Denies arthralgias, myalgias, stiffness, jt. swelling, pain, limping or strain/sprain.  Skin: Denies pruritus, rash, hives, warts, acne, eczema or change in skin lesion(s). Neuro: No weakness, tremor, incoordination, spasms, paresthesia or pain. Psychiatric: Denies confusion,  memory loss or sensory loss. Endo: Denies change in weight, skin or hair change.  Heme/Lymph: No excessive bleeding, bruising or enlarged lymph nodes.  Physical Exam  BP 132/76   Pulse 76   Temp 97.6 F (36.4 C)   Resp 16   Ht 6\' 1"  (1.854 m)   Wt 199 lb (90.3 kg)   BMI 26.25 kg/m   Appears  well nourished, well groomed  and in no distress.  Eyes:  PERRLA, EOMs, conjunctiva no swelling or erythema. Sinuses: No frontal/maxillary tenderness ENT/Mouth: EAC's clear, TM's nl w/o erythema, bulging. Nares clear w/o erythema, swelling, exudates. Oropharynx clear without erythema or exudates. Oral hygiene is good. Tongue normal, non obstructing. Hearing intact.  Neck: Supple. Thyroid not palpable. Car 2+/2+ without bruits, nodes or JVD. Chest: Respirations nl with BS clear & equal w/o rales, rhonchi, wheezing or stridor.  Cor: Heart sounds normal w/ regular rate and rhythm without sig. murmurs, gallops, clicks or rubs. Peripheral pulses normal and equal  without edema.  Abdomen: Soft & bowel sounds normal. Non-tender w/o guarding, rebound, hernias, masses or organomegaly.  Lymphatics: Unremarkable.  Musculoskeletal: Full ROM all peripheral extremities, joint stability, 5/5 strength and normal gait.  Skin: Warm, dry without exposed rashes, lesions or ecchymosis apparent.  Neuro: Cranial nerves intact, reflexes equal bilaterally. Sensory-motor testing grossly intact. Tendon reflexes grossly intact.  Pysch: Alert & oriented x 3.  Insight and judgement nl & appropriate. No ideations.  Assessment and Plan:  1. Essential hypertension  - Continue medication, monitor blood pressure at home.  - Continue DASH diet.  Reminder to go to the ER if any CP,  SOB, nausea, dizziness, severe HA, changes vision/speech.  2. Hyperlipidemia, mixed  - Continue diet/meds, exercise,& lifestyle modifications.  3. Abnormal glucose  - Continue diet, exercise  - Lifestyle modifications.  - Monitor appropriate labs.  4. Vitamin D deficiency  - Continue supplementation.  5. Prediabetes  - Continue diet, exercise  - Lifestyle modifications.  - Monitor appropriate labs.  6. Medication management  7. Anxiety and depression  - escitalopram (LEXAPRO) 10 MG tablet; Take 1 tablet Daily for Anxiety  Dispense: 90 tablet; Refill: 1       Discussed  regular  exercise, BP monitoring, weight control to achieve/maintain BMI less than 25 and discussed med and SE's.  Patient is completing 90 days on a new job & insurance benefits not taken effect yet - so labs deferred today. He indicates when he is coverd for labs , he will call to schedule a lab visit. I discussed the assessment and treatment plan with the patient. The patient was provided an opportunity to ask questions and all were answered. The patient agreed with the plan and demonstrated an understanding of the instructions.  I provided over 22 minutes of exam, counseling, chart review and  complex critical decision making.  Marinus MawWilliam D Celina Shiley, MD

## 2019-04-23 ENCOUNTER — Encounter: Payer: Self-pay | Admitting: Internal Medicine

## 2019-04-23 MED ORDER — ESCITALOPRAM OXALATE 10 MG PO TABS: ORAL_TABLET | ORAL | 1 refills | Status: DC

## 2019-04-23 NOTE — Patient Instructions (Signed)

## 2019-04-25 ENCOUNTER — Other Ambulatory Visit: Payer: Self-pay | Admitting: Internal Medicine

## 2019-04-25 DIAGNOSIS — F341 Dysthymic disorder: Secondary | ICD-10-CM

## 2019-04-25 DIAGNOSIS — I10 Essential (primary) hypertension: Secondary | ICD-10-CM

## 2019-05-01 ENCOUNTER — Other Ambulatory Visit: Payer: Self-pay | Admitting: Internal Medicine

## 2019-05-01 DIAGNOSIS — F419 Anxiety disorder, unspecified: Secondary | ICD-10-CM

## 2019-05-01 DIAGNOSIS — F329 Major depressive disorder, single episode, unspecified: Secondary | ICD-10-CM

## 2019-07-15 ENCOUNTER — Other Ambulatory Visit: Payer: Self-pay | Admitting: Internal Medicine

## 2019-07-15 DIAGNOSIS — I10 Essential (primary) hypertension: Secondary | ICD-10-CM

## 2019-07-26 ENCOUNTER — Other Ambulatory Visit: Payer: Self-pay | Admitting: Internal Medicine

## 2019-07-26 DIAGNOSIS — I10 Essential (primary) hypertension: Secondary | ICD-10-CM

## 2019-07-27 ENCOUNTER — Other Ambulatory Visit: Payer: Self-pay | Admitting: Adult Health

## 2019-07-27 DIAGNOSIS — F341 Dysthymic disorder: Secondary | ICD-10-CM

## 2019-08-24 ENCOUNTER — Other Ambulatory Visit: Payer: Self-pay | Admitting: Internal Medicine

## 2019-08-24 ENCOUNTER — Ambulatory Visit: Payer: Self-pay | Admitting: Internal Medicine

## 2019-08-24 DIAGNOSIS — E782 Mixed hyperlipidemia: Secondary | ICD-10-CM

## 2019-08-27 ENCOUNTER — Other Ambulatory Visit: Payer: Self-pay | Admitting: Internal Medicine

## 2019-08-27 DIAGNOSIS — E782 Mixed hyperlipidemia: Secondary | ICD-10-CM

## 2019-09-13 ENCOUNTER — Other Ambulatory Visit: Payer: Self-pay | Admitting: Internal Medicine

## 2019-09-13 DIAGNOSIS — E782 Mixed hyperlipidemia: Secondary | ICD-10-CM

## 2019-10-21 ENCOUNTER — Other Ambulatory Visit: Payer: Self-pay | Admitting: Internal Medicine

## 2019-10-21 DIAGNOSIS — I10 Essential (primary) hypertension: Secondary | ICD-10-CM

## 2019-10-23 NOTE — Progress Notes (Signed)
        N  O       S  H  O  W                                                                                                                                                                                      This very nice 48 y.o.male presents for follow up with HTN, HLD, Pre-Diabetes and Vitamin D Deficiency.       Patient is treated for HTN(Oct 2017) & BP has been controlled at home. Today's  . Patient has had no complaints of any cardiac type chest pain, palpitations, dyspnea / orthopnea / PND, dizziness, claudication, or dependent edema.      Hyperlipidemia is controlled with diet & Rosuvastatin. Patient denies myalgias or other med SE's. Last Lipids were at goal except slightly elevated Trig's:  Lab Results  Component Value Date   CHOL 144 01/28/2018   HDL 36 (L) 01/28/2018   LDLCALC 78 01/28/2018   TRIG 199 (H) 01/28/2018   CHOLHDL 4.0 01/28/2018    Also, the patient has history of PreDiabetes (4 / 2019)  and patient denies any symptoms of reactive hypoglycemia, diabetic polys, paresthesias or visual blurring.  Last A1c was not at goal: Lab Results  Component Value Date   HGBA1C 5.7 (H) 01/28/2018        Further, the patient also has history of Vitamin D Deficiency   ("24" / 2017)  and supplements vitamin D without any suspected side-effects. Last vitamin D was at goal:  Lab Results  Component Value Date   VD25OH 68 10/27/2017

## 2019-10-24 ENCOUNTER — Ambulatory Visit: Payer: Self-pay | Admitting: Internal Medicine

## 2019-10-25 ENCOUNTER — Other Ambulatory Visit: Payer: Self-pay | Admitting: Internal Medicine

## 2019-10-25 ENCOUNTER — Other Ambulatory Visit: Payer: Self-pay | Admitting: Adult Health

## 2019-10-25 DIAGNOSIS — F341 Dysthymic disorder: Secondary | ICD-10-CM

## 2019-10-25 DIAGNOSIS — I10 Essential (primary) hypertension: Secondary | ICD-10-CM

## 2019-11-23 ENCOUNTER — Other Ambulatory Visit: Payer: Self-pay | Admitting: Internal Medicine

## 2019-11-23 DIAGNOSIS — I10 Essential (primary) hypertension: Secondary | ICD-10-CM

## 2019-11-23 DIAGNOSIS — E559 Vitamin D deficiency, unspecified: Secondary | ICD-10-CM | POA: Insufficient documentation

## 2019-11-23 DIAGNOSIS — R7309 Other abnormal glucose: Secondary | ICD-10-CM | POA: Insufficient documentation

## 2019-11-23 DIAGNOSIS — Z79899 Other long term (current) drug therapy: Secondary | ICD-10-CM | POA: Insufficient documentation

## 2019-11-23 DIAGNOSIS — Z0000 Encounter for general adult medical examination without abnormal findings: Secondary | ICD-10-CM | POA: Insufficient documentation

## 2019-11-23 NOTE — Progress Notes (Signed)
FOLLOW UP  Assessment and Plan:  Foot pain Check gout No fm history of RA no other pain, rash etc,  Likely OA Will ice, rest, if not better follow up with ortho/podiatry  Depression/anxiety Continue medications Lifestyle discussed: diet/exerise, sleep hygiene, stress management, hydration  Hypertension At goal; continue medications Monitor blood pressure at home; call if consistently over 130/80 Continue DASH diet.   Reminder to go to the ER if any CP, SOB, nausea, dizziness, severe HA, changes vision/speech, left arm numbness and tingling and jaw pain.  Cholesterol Currently above goal; continue crestor; diet discussed  Continue low cholesterol diet and exercise.  Check lipid panel.   Abnormal glucose Continue diet and exercise.  Perform daily foot/skin check, notify office of any concerning changes.  Check A1C  Overweight Long discussion about weight loss, diet, and exercise Recommended diet heavy in fruits and veggies and low in animal meats, cheeses, and dairy products, appropriate calorie intake Discussed ideal weight for height  Will follow up in 3 months  Vitamin D Def At goal at last visit; continue supplementation to maintain goal of 70-100 Defer Vit D level  Continue diet and meds as discussed. Further disposition pending results of labs. Discussed med's effects and SE's.   Over 30 minutes of exam, counseling, chart review, and critical decision making was performed.   Future Appointments  Date Time Provider Department Center  02/01/2020  2:00 PM Lucky Cowboy, MD [facility] None    ----------------------------------------------------------------------------------------------------------------------  HPI 49 y.o. male  presents for 3 month follow up on hypertension, cholesterol, prediabetes, weight, anxiety and vitamin D deficiency.   he has a diagnosis of depression/anxiety and is currently on wellburtin ER 300 mg, on lexapro 10mg  and is not on xanax  due to work restrictions.   Had an episode of shingles, treated at [facility].   He has been having left foot pain x 1 month, has improved slightly, no injury. Dorsum on left foot with some pain medially, very red, some swelling a MTP's. No numbness or tingling.   BMI is Body mass index is 26.65 kg/m., he has been working on diet and exercise. Wt Readings from Last 3 Encounters:  11/24/19 202 lb (91.6 kg)  04/21/19 199 lb (90.3 kg)  12/03/18 203 lb 9.6 oz (92.4 kg)   His blood pressure has been controlled at home, norvasc 10, ziac 10, and benicar 40 1/2 tablet, today their BP is BP: 110/62  He does workout. He denies chest pain, shortness of breath, dizziness. Lab Results  Component Value Date   GFRNONAA 64 01/28/2018     He is on cholesterol medication (rosuvastatin 20 mg daily) and denies myalgias. His cholesterol is not at goal. The cholesterol last visit was:   Lab Results  Component Value Date   CHOL 144 01/28/2018   HDL 36 (L) 01/28/2018   LDLCALC 78 01/28/2018   TRIG 199 (H) 01/28/2018   CHOLHDL 4.0 01/28/2018    He has been working on diet and exercise for prediabetes, and denies foot ulcerations, hyperglycemia, increased appetite, nausea, paresthesia of the feet, polydipsia, polyuria, visual disturbances, vomiting and weight loss. Last A1C in the office was:  Lab Results  Component Value Date   HGBA1C 5.7 (H) 01/28/2018   Patient is on Vitamin D supplement and at goal at recent check:    Lab Results  Component Value Date   VD25OH 68 10/27/2017        Current Medications:    Current Outpatient Medications (Cardiovascular):  .  amLODipine (NORVASC) 10 MG tablet, Take 1 tablet Daily for BP - need office visit for refill .  bisoprolol-hydrochlorothiazide (ZIAC) 10-6.25 MG tablet, TAKE ONE TABLET BY MOUTH DAILY FOR BLOOD PRESSURE .  olmesartan (BENICAR) 40 MG tablet, Take 1/2 to 1 tablet Daily for BP .  rosuvastatin (CRESTOR) 40 MG tablet, Take 1 tablet Daily for  Cholesterol    Current Outpatient Medications (Hematological):  Marland Kitchen  IRON, FERROUS SULFATE, PO, Take by mouth daily.  Current Outpatient Medications (Other):  Marland Kitchen  buPROPion (WELLBUTRIN XL) 300 MG 24 hr tablet, Take 1 tablet Daily for Mood - Needs office visit for further refill .  escitalopram (LEXAPRO) 10 MG tablet, Take 1 tablet Daily for Chronic Anxiety .  VITAMIN D, CHOLECALCIFEROL, PO, Take 10,000 Units by mouth daily.   Allergies: No Known Allergies   Medical History:  Past Medical History:  Diagnosis Date  . Allergy   . GERD (gastroesophageal reflux disease)    takes prilosec every day  . HTN (hypertension) 08/20/2016  . Hyperlipidemia 01/27/2018  . Kidney stones    last stone was in 2010   Family history- Reviewed and unchanged Social history- Reviewed and unchanged   Review of Systems:  Review of Systems  Constitutional: Negative for malaise/fatigue and weight loss.  HENT: Negative for hearing loss and tinnitus.   Eyes: Negative for blurred vision and double vision.  Respiratory: Negative for cough, shortness of breath and wheezing.   Cardiovascular: Negative for chest pain, palpitations, orthopnea, claudication and leg swelling.  Gastrointestinal: Negative for abdominal pain, blood in stool, constipation, diarrhea, heartburn, melena, nausea and vomiting.  Genitourinary: Negative.   Musculoskeletal: Negative for joint pain and myalgias.  Skin: Negative for rash.  Neurological: Negative for dizziness, tingling, sensory change, weakness and headaches.  Endo/Heme/Allergies: Negative for polydipsia.  Psychiatric/Behavioral: Negative for depression, hallucinations, substance abuse and suicidal ideas. The patient is nervous/anxious. The patient does not have insomnia.   All other systems reviewed and are negative.   Physical Exam: BP 110/62   Pulse 69   Temp 97.7 F (36.5 C)   Ht 6\' 1"  (1.854 m)   Wt 202 lb (91.6 kg)   SpO2 95%   BMI 26.65 kg/m  Wt Readings from  Last 3 Encounters:  11/24/19 202 lb (91.6 kg)  04/21/19 199 lb (90.3 kg)  12/03/18 203 lb 9.6 oz (92.4 kg)   General Appearance: Well nourished, in no apparent distress. Eyes: PERRLA, EOMs, conjunctiva no swelling or erythema Sinuses: No Frontal/maxillary tenderness ENT/Mouth: Ext aud canals clear, TMs without erythema, bulging. No erythema, swelling, or exudate on post pharynx.  Tonsils not swollen or erythematous. Hearing normal.  Neck: Supple, thyroid normal.  Respiratory: Respiratory effort normal, BS equal bilaterally without rales, rhonchi, wheezing or stridor.  Cardio: RRR with no MRGs. Brisk peripheral pulses without edema.  Abdomen: Soft, + BS.  Non tender, no guarding, rebound, hernias, masses. Lymphatics: Non tender without lymphadenopathy.  Musculoskeletal: Full ROM, 5/5 strength, Normal gait, left dorsal foot with tenderness at 2nd and 3rd MTP, + squeeze test, no numbness, good cap refill.  Skin: Warm, dry without rashes, lesions, ecchymosis.  Neuro: Cranial nerves intact. No cerebellar symptoms.  Psych: Awake and oriented X 3, normal affect, Insight and Judgment appropriate.    Vicie Mutters, PA-C 3:51 PM [facility]

## 2019-11-24 ENCOUNTER — Ambulatory Visit (INDEPENDENT_AMBULATORY_CARE_PROVIDER_SITE_OTHER): Payer: BC Managed Care – PPO | Admitting: Physician Assistant

## 2019-11-24 ENCOUNTER — Other Ambulatory Visit: Payer: Self-pay

## 2019-11-24 ENCOUNTER — Encounter: Payer: Self-pay | Admitting: Physician Assistant

## 2019-11-24 VITALS — BP 110/62 | HR 69 | Temp 97.7°F | Ht 73.0 in | Wt 202.0 lb

## 2019-11-24 DIAGNOSIS — Z79899 Other long term (current) drug therapy: Secondary | ICD-10-CM | POA: Diagnosis not present

## 2019-11-24 DIAGNOSIS — E782 Mixed hyperlipidemia: Secondary | ICD-10-CM | POA: Diagnosis not present

## 2019-11-24 DIAGNOSIS — I10 Essential (primary) hypertension: Secondary | ICD-10-CM | POA: Diagnosis not present

## 2019-11-24 DIAGNOSIS — F419 Anxiety disorder, unspecified: Secondary | ICD-10-CM

## 2019-11-24 DIAGNOSIS — R7309 Other abnormal glucose: Secondary | ICD-10-CM | POA: Diagnosis not present

## 2019-11-24 DIAGNOSIS — M79672 Pain in left foot: Secondary | ICD-10-CM

## 2019-11-24 DIAGNOSIS — E559 Vitamin D deficiency, unspecified: Secondary | ICD-10-CM | POA: Diagnosis not present

## 2019-11-24 DIAGNOSIS — F329 Major depressive disorder, single episode, unspecified: Secondary | ICD-10-CM

## 2019-11-24 DIAGNOSIS — E663 Overweight: Secondary | ICD-10-CM

## 2019-11-24 DIAGNOSIS — F341 Dysthymic disorder: Secondary | ICD-10-CM

## 2019-11-24 MED ORDER — ESCITALOPRAM OXALATE 10 MG PO TABS: ORAL_TABLET | ORAL | 1 refills | Status: DC

## 2019-11-24 MED ORDER — BUPROPION HCL ER (XL) 300 MG PO TB24: ORAL_TABLET | ORAL | 1 refills | Status: DC

## 2019-11-24 MED ORDER — AMLODIPINE BESYLATE 10 MG PO TABS: ORAL_TABLET | ORAL | 1 refills | Status: DC

## 2019-11-24 NOTE — Patient Instructions (Signed)
Ice your foot, rest, get rigid insert If it is not better we can refer to ortho/podiatry  Will check uric acid to rule out gout.    Foot Pain Many things can cause foot pain. Some common causes are:  An injury.  A sprain.  Arthritis.  Blisters.  Bunions. Follow these instructions at home: Managing pain, stiffness, and swelling If directed, put ice on the painful area:  Put ice in a plastic bag.  Place a towel between your skin and the bag.  Leave the ice on for 20 minutes, 2-3 times a day.  Activity  Do not stand or walk for long periods.  Return to your normal activities as told by your health care provider. Ask your health care provider what activities are safe for you.  Do stretches to relieve foot pain and stiffness as told by your health care provider.  Do not lift anything that is heavier than 10 lb (4.5 kg), or the limit that you are told, until your health care provider says that it is safe. Lifting a lot of weight can put added pressure on your feet. Lifestyle  Wear comfortable, supportive shoes that fit you well. Do not wear high heels.  Keep your feet clean and dry. General instructions  Take over-the-counter and prescription medicines only as told by your health care provider.  Rub your foot gently.  Pay attention to any changes in your symptoms.  Keep all follow-up visits as told by your health care provider. This is important. Contact a health care provider if:  Your pain does not get better after a few days of self-care.  Your pain gets worse.  You cannot stand on your foot. Get help right away if:  Your foot is numb or tingling.  Your foot or toes are swollen.  Your foot or toes turn white or blue.  You have warmth and redness along your foot. Summary  Common causes of foot pain are injury, sprain, arthritis, blisters or bunions.  Ice, medicines, and comfortable shoes may help foot pain.  Contact your health care provider if  your pain does not get better after a few days of self-care. This information is not intended to replace advice given to you by your health care provider. Make sure you discuss any questions you have with your health care provider. Document Revised: 07/22/2018 Document Reviewed: 07/22/2018 Elsevier Patient Education  2020 [employer].

## 2019-11-25 ENCOUNTER — Other Ambulatory Visit: Payer: Self-pay | Admitting: Internal Medicine

## 2019-11-25 DIAGNOSIS — I10 Essential (primary) hypertension: Secondary | ICD-10-CM

## 2019-11-25 LAB — LIPID PANEL
Cholesterol: 172 mg/dL (ref <200)
HDL: 39 mg/dL — ABNORMAL LOW (ref >=40)
LDL Cholesterol (Calc): 92 mg/dL (calc)
Non-HDL Cholesterol (Calc): 133 mg/dL (calc) — ABNORMAL HIGH (ref <130)
Total CHOL/HDL Ratio: 4.4 (calc) (ref <5.0)
Triglycerides: 307 mg/dL — ABNORMAL HIGH (ref <150)

## 2019-11-25 LAB — URIC ACID: Uric Acid, Serum: 2.1 mg/dL — ABNORMAL LOW (ref 4.0–8.0)

## 2019-11-25 LAB — COMPLETE METABOLIC PANEL WITH GFR
AG Ratio: 2.3 (calc) (ref 1.0–2.5)
ALT: 19 U/L (ref 9–46)
AST: 23 U/L (ref 10–40)
Albumin: 4.4 g/dL (ref 3.6–5.1)
Alkaline phosphatase (APISO): 82 U/L (ref 36–130)
BUN: 16 mg/dL (ref 7–25)
CO2: 24 mmol/L (ref 20–32)
Calcium: 9.5 mg/dL (ref 8.6–10.3)
Chloride: 106 mmol/L (ref 98–110)
Creat: 1.05 mg/dL (ref 0.60–1.35)
GFR, Est African American: 97 mL/min/{1.73_m2} (ref >=60)
GFR, Est Non African American: 84 mL/min/{1.73_m2} (ref >=60)
Globulin: 1.9 g/dL (calc) (ref 1.9–3.7)
Glucose, Bld: 83 mg/dL (ref 65–99)
Potassium: 4 mmol/L (ref 3.5–5.3)
Sodium: 141 mmol/L (ref 135–146)
Total Bilirubin: 0.2 mg/dL (ref 0.2–1.2)
Total Protein: 6.3 g/dL (ref 6.1–8.1)

## 2019-11-25 LAB — CBC WITH DIFFERENTIAL/PLATELET
Absolute Monocytes: 764 cells/uL (ref 200–950)
Basophils Absolute: 91 cells/uL (ref 0–200)
Basophils Relative: 1.1 %
Eosinophils Absolute: 291 cells/uL (ref 15–500)
Eosinophils Relative: 3.5 %
HCT: 37 % — ABNORMAL LOW (ref 38.5–50.0)
Hemoglobin: 12.8 g/dL — ABNORMAL LOW (ref 13.2–17.1)
Lymphs Abs: 2656 cells/uL (ref 850–3900)
MCH: 31.5 pg (ref 27.0–33.0)
MCHC: 34.6 g/dL (ref 32.0–36.0)
MCV: 91.1 fL (ref 80.0–100.0)
MPV: 9.3 fL (ref 7.5–12.5)
Monocytes Relative: 9.2 %
Neutro Abs: 4499 cells/uL (ref 1500–7800)
Neutrophils Relative %: 54.2 %
Platelets: 387 10*3/uL (ref 140–400)
RBC: 4.06 10*6/uL — ABNORMAL LOW (ref 4.20–5.80)
RDW: 12.5 % (ref 11.0–15.0)
Total Lymphocyte: 32 %
WBC: 8.3 10*3/uL (ref 3.8–10.8)

## 2019-11-25 LAB — HEMOGLOBIN A1C
Hgb A1c MFr Bld: 5.4 % of total Hgb (ref <5.7)
Mean Plasma Glucose: 108 (calc)
eAG (mmol/L): 6 (calc)

## 2019-11-25 LAB — VITAMIN D 25 HYDROXY (VIT D DEFICIENCY, FRACTURES): Vit D, 25-Hydroxy: 80 ng/mL (ref 30–100)

## 2019-11-25 LAB — MAGNESIUM: Magnesium: 1.8 mg/dL (ref 1.5–2.5)

## 2019-11-25 LAB — TSH: TSH: 0.92 mIU/L (ref 0.40–4.50)

## 2019-11-27 ENCOUNTER — Other Ambulatory Visit: Payer: Self-pay | Admitting: Internal Medicine

## 2019-11-27 DIAGNOSIS — I10 Essential (primary) hypertension: Secondary | ICD-10-CM

## 2019-11-27 DIAGNOSIS — E782 Mixed hyperlipidemia: Secondary | ICD-10-CM

## 2019-11-27 DIAGNOSIS — F329 Major depressive disorder, single episode, unspecified: Secondary | ICD-10-CM

## 2019-11-27 DIAGNOSIS — F419 Anxiety disorder, unspecified: Secondary | ICD-10-CM

## 2019-11-27 MED ORDER — ROSUVASTATIN CALCIUM 40 MG PO TABS: ORAL_TABLET | ORAL | 1 refills | Status: DC

## 2019-11-27 MED ORDER — ESCITALOPRAM OXALATE 10 MG PO TABS: ORAL_TABLET | ORAL | 1 refills | Status: DC

## 2019-11-27 MED ORDER — OLMESARTAN MEDOXOMIL 40 MG PO TABS: ORAL_TABLET | ORAL | 1 refills | Status: DC

## 2019-11-27 MED ORDER — AMLODIPINE BESYLATE 10 MG PO TABS: ORAL_TABLET | ORAL | 1 refills | Status: DC

## 2019-11-29 ENCOUNTER — Other Ambulatory Visit: Payer: Self-pay | Admitting: Internal Medicine

## 2019-11-29 DIAGNOSIS — F329 Major depressive disorder, single episode, unspecified: Secondary | ICD-10-CM

## 2019-11-29 DIAGNOSIS — E782 Mixed hyperlipidemia: Secondary | ICD-10-CM

## 2019-11-29 DIAGNOSIS — F32A Depression, unspecified: Secondary | ICD-10-CM

## 2019-11-29 DIAGNOSIS — I10 Essential (primary) hypertension: Secondary | ICD-10-CM

## 2019-11-29 MED ORDER — ROSUVASTATIN CALCIUM 40 MG PO TABS: ORAL_TABLET | ORAL | 1 refills | Status: DC

## 2019-11-29 MED ORDER — AMLODIPINE BESYLATE 10 MG PO TABS: ORAL_TABLET | ORAL | 1 refills | Status: DC

## 2019-11-29 MED ORDER — ESCITALOPRAM OXALATE 10 MG PO TABS: ORAL_TABLET | ORAL | 1 refills | Status: DC

## 2019-11-29 MED ORDER — OLMESARTAN MEDOXOMIL 40 MG PO TABS: ORAL_TABLET | ORAL | 1 refills | Status: DC

## 2019-12-28 ENCOUNTER — Other Ambulatory Visit: Payer: Self-pay

## 2019-12-28 DIAGNOSIS — F341 Dysthymic disorder: Secondary | ICD-10-CM

## 2019-12-28 MED ORDER — BUPROPION HCL ER (XL) 300 MG PO TB24: ORAL_TABLET | ORAL | 1 refills | Status: DC

## 2020-02-01 ENCOUNTER — Encounter: Payer: Commercial Managed Care - PPO | Admitting: Internal Medicine

## 2020-04-08 ENCOUNTER — Encounter: Payer: Self-pay | Admitting: Internal Medicine

## 2020-04-08 NOTE — Progress Notes (Addendum)
    Called morning of app't  To  Reschedule

## 2020-04-09 ENCOUNTER — Ambulatory Visit: Payer: BC Managed Care – PPO | Admitting: Internal Medicine

## 2020-04-11 ENCOUNTER — Encounter: Payer: Self-pay | Admitting: Internal Medicine

## 2020-04-11 NOTE — Patient Instructions (Signed)

## 2020-04-11 NOTE — Progress Notes (Signed)
Annual  Screening/Preventative Visit  & Comprehensive Evaluation & Examination     This very nice 49 y.o.  [name] presents for a Screening /Preventative Visit & comprehensive evaluation and management of multiple medical co-morbidities.  Patient has been followed for HTN, HLD, Prediabetes and Vitamin D Deficiency.     HTN predates since  Oct 2017. Patient's BP has been controlled at home.  Today's BP is at goal - 114/68. Patient denies any cardiac symptoms as chest pain, palpitations, shortness of breath, dizziness or ankle swelling.     Patient's hyperlipidemia is controlled with diet and medications. Patient denies myalgias or other medication SE's. Last lipids were at goal except elevated Trig's:  Lab Results  Component Value Date   CHOL 172 11/24/2019   HDL 39 (L) 11/24/2019   LDLCALC 92 11/24/2019   TRIG 307 (H) 11/24/2019   CHOLHDL 4.4 11/24/2019       Patient has hx/o prediabetes (A1c 5.7%  / Apr 2019)  and patient denies reactive hypoglycemic symptoms, visual blurring, diabetic polys or paresthesias. Last A1c was  Lab Results  Component Value Date   HGBA1C 5.4 11/24/2019        Finally, patient has history of Vitamin D Deficiency ("24" /Sept.2017) and last vitamin D was  Lab Results  Component Value Date   VD25OH 27 11/24/2019    Current Outpatient Medications on File Prior to Visit  Medication Sig  . amLODipine (NORVASC) 10 MG tablet Take 1 tablet Daily for BP  . bisoprolol-hydrochlorothiazide (ZIAC) 10-6.25 MG tablet TAKE ONE TABLET BY MOUTH DAILY FOR BLOOD PRESSURE  . buPROPion (WELLBUTRIN XL) 300 MG 24 hr tablet Take 1 tablet Daily for Mood -  . escitalopram (LEXAPRO) 10 MG tablet Take 1 tablet Daily for Chronic Anxiety  . IRON, FERROUS SULFATE, PO Take by mouth daily.  Marland Kitchen olmesartan (BENICAR) 40 MG tablet Take 1 tablet Daily for BP  . rosuvastatin (CRESTOR) 40 MG tablet Take 1 tablet Daily for Cholesterol  . VITAMIN D, CHOLECALCIFEROL, PO Take 10,000 Units by  mouth daily.   No current facility-administered medications on file prior to visit.   No Known Allergies   Past Medical History:  Diagnosis Date  . Allergy   . GERD (gastroesophageal reflux disease)    takes prilosec every day  . HTN (hypertension) 08/20/2016  . Hyperlipidemia 01/27/2018  . Kidney stones    last stone was in 2010   Health Maintenance  Topic Date Due  . Hepatitis C Screening  Never done  . COVID-19 Vaccine (1) Never done  . HIV Screening  Never done  . INFLUENZA VACCINE  05/20/2020  . TETANUS/TDAP  04/12/2030   Immunization History  Administered Date(s) Administered  . PPD Test 04/12/2020  . Tdap 04/12/2020    Past Surgical History:  Procedure Laterality Date  . ANTERIOR CRUCIATE LIGAMENT REPAIR Left   . CHEST TUBE INSERTION    . LITHOTRIPSY    . WISDOM TOOTH EXTRACTION     Family History  Problem Relation Age of Onset  . Hypertension Mother   . Stroke Mother   . Diabetes Mother   . Kidney disease Paternal Grandfather   . Heart disease Father 97       pacemaker and MI  . Diabetes Father    Social History   Socioeconomic History  . Marital status: Married  . Number of children: Not on file  . Years of education: Not on file  . Highest education level: Not on file  Occupational History  . Not on file  Tobacco Use  . Smoking status: Former Smoker    Packs/day: 1.00    Years: 25.00    Pack years: 25.00    Types: Cigarettes  . Smokeless tobacco: Never Used  Substance and Sexual Activity  . Alcohol use: Not on file  . Drug use: No  . Sexual activity: Not on file     ROS Constitutional: Denies fever, chills, weight loss/gain, headaches, insomnia,  night sweats or change in appetite. Does c/o fatigue. Eyes: Denies redness, blurred vision, diplopia, discharge, itchy or watery eyes.  ENT: Denies discharge, congestion, post nasal drip, epistaxis, sore throat, earache, hearing loss, dental pain, Tinnitus, Vertigo, Sinus pain or snoring.    Cardio: Denies chest pain, palpitations, irregular heartbeat, syncope, dyspnea, diaphoresis, orthopnea, PND, claudication or edema Respiratory: denies cough, dyspnea, DOE, pleurisy, hoarseness, laryngitis or wheezing.  Gastrointestinal: Denies dysphagia, heartburn, reflux, water brash, pain, cramps, nausea, vomiting, bloating, diarrhea, constipation, hematemesis, melena, hematochezia, jaundice or hemorrhoids Genitourinary: Denies dysuria, frequency, urgency, nocturia, hesitancy, discharge, hematuria or flank pain Musculoskeletal: Denies arthralgia, myalgia, stiffness, Jt. Swelling, pain, limp or strain/sprain. Denies Falls. Skin: Denies puritis, rash, hives, warts, acne, eczema or change in skin lesion Neuro: No weakness, tremor, incoordination, spasms, paresthesia or pain Psychiatric: Denies confusion, memory loss or sensory loss. Denies Depression. Endocrine: Denies change in weight, skin, hair change, nocturia, and paresthesia, diabetic polys, visual blurring or hyper / hypo glycemic episodes.  Heme/Lymph: No excessive bleeding, bruising or enlarged lymph nodes.  Physical Exam  BP 114/68   Pulse 72   Temp 97.8 F (36.6 C)   Resp 16   Ht 6\' 1"  (1.854 m)   Wt 216 lb 6.4 oz (98.2 kg)   BMI 28.55 kg/m   General Appearance: Well nourished and well groomed and in no apparent distress.  Eyes: PERRLA, EOMs, conjunctiva no swelling or erythema, normal fundi and vessels. Sinuses: No frontal/maxillary tenderness ENT/Mouth: EACs patent / TMs  nl. Nares clear without erythema, swelling, mucoid exudates. Oral hygiene is good. No erythema, swelling, or exudate. Tongue normal, non-obstructing. Tonsils not swollen or erythematous. Hearing normal.  Neck: Supple, thyroid not palpable. No bruits, nodes or JVD. Respiratory: Respiratory effort normal.  BS equal and clear bilateral without rales, rhonci, wheezing or stridor. Cardio: Heart sounds are normal with regular rate and rhythm and no murmurs,  rubs or gallops. Peripheral pulses are normal and equal bilaterally without edema. No aortic or femoral bruits. Chest: symmetric with normal excursions and percussion.  Abdomen: Soft, with Nl bowel sounds. Nontender, no guarding, rebound, hernias, masses, or organomegaly.  Lymphatics: Non tender without lymphadenopathy.  Musculoskeletal: Full ROM all peripheral extremities, joint stability, 5/5 strength, and normal gait. Skin: Warm and dry without rashes, lesions, cyanosis, clubbing or  ecchymosis.  Neuro: Cranial nerves intact, reflexes equal bilaterally. Normal muscle tone, no cerebellar symptoms. Sensation intact.  Pysch: Alert and oriented X 3 with normal affect, insight and judgment appropriate.   Assessment and Plan  1. Annual Preventative/Screening Exam   2. Essential hypertension  - EKG 12-Lead - Urinalysis, Routine w reflex microscopic - Microalbumin / creatinine urine ratio - CBC with Differential/Platelet - COMPLETE METABOLIC PANEL WITH GFR - Magnesium - TSH  3. Hyperlipidemia, mixed  - EKG 12-Lead - Lipid panel - TSH  4. Abnormal glucose  - EKG 12-Lead - Hemoglobin A1c - Insulin, random  5. Vitamin D deficiency  - VITAMIN D 25 Hydroxy  6. Screening for colorectal cancer  - POC  Hemoccult Bld/Stl   7. Screening-pulmonary TB  - TB Skin Test  8. Prostate cancer screening  - PSA  9. Screening for ischemic heart disease ** - EKG 12-Lead  10. FHx: heart disease  - EKG 12-Lead  11. Former smoker  - EKG 12-Lead  12. Fatigue  - Iron,Total/Total Iron Binding Cap - Vitamin B12 - Testosterone - CBC with Differential/Platelet - TSH  13. Medication management  - Urinalysis, Routine w reflex microscopic - Microalbumin / creatinine urine ratio - CBC with Differential/Platelet - COMPLETE METABOLIC PANEL WITH GFR - Magnesium - Lipid panel - TSH - Hemoglobin A1c - Insulin, random - VITAMIN D 25 Hydroxy  14. Need for prophylactic vaccination  with combined diphtheria-tetanus-pertussis (DTP) vaccine  - Tdap vaccine greater than or equal to 7yo IM          Patient was counseled in prudent diet, weight control to achieve/maintain BMI less than 25, BP monitoring, regular exercise and medications as discussed.  Discussed med effects and SE's. Routine screening labs and tests as requested with regular follow-up as recommended. Over 40 minutes of exam, counseling, chart review and high complex critical decision making was performed   Joshua Maw, MD

## 2020-04-12 ENCOUNTER — Other Ambulatory Visit: Payer: Self-pay

## 2020-04-12 ENCOUNTER — Ambulatory Visit (INDEPENDENT_AMBULATORY_CARE_PROVIDER_SITE_OTHER): Payer: BC Managed Care – PPO | Admitting: Internal Medicine

## 2020-04-12 VITALS — BP 114/68 | HR 72 | Temp 97.8°F | Resp 16 | Ht 73.0 in | Wt 216.4 lb

## 2020-04-12 DIAGNOSIS — R350 Frequency of micturition: Secondary | ICD-10-CM | POA: Diagnosis not present

## 2020-04-12 DIAGNOSIS — Z1329 Encounter for screening for other suspected endocrine disorder: Secondary | ICD-10-CM

## 2020-04-12 DIAGNOSIS — Z125 Encounter for screening for malignant neoplasm of prostate: Secondary | ICD-10-CM

## 2020-04-12 DIAGNOSIS — Z79899 Other long term (current) drug therapy: Secondary | ICD-10-CM | POA: Diagnosis not present

## 2020-04-12 DIAGNOSIS — Z130 Encounter for screening for diseases of the blood and blood-forming organs and certain disorders involving the immune mechanism: Secondary | ICD-10-CM

## 2020-04-12 DIAGNOSIS — I10 Essential (primary) hypertension: Secondary | ICD-10-CM

## 2020-04-12 DIAGNOSIS — R7309 Other abnormal glucose: Secondary | ICD-10-CM

## 2020-04-12 DIAGNOSIS — E559 Vitamin D deficiency, unspecified: Secondary | ICD-10-CM | POA: Diagnosis not present

## 2020-04-12 DIAGNOSIS — Z1389 Encounter for screening for other disorder: Secondary | ICD-10-CM | POA: Diagnosis not present

## 2020-04-12 DIAGNOSIS — Z111 Encounter for screening for respiratory tuberculosis: Secondary | ICD-10-CM | POA: Diagnosis not present

## 2020-04-12 DIAGNOSIS — Z8249 Family history of ischemic heart disease and other diseases of the circulatory system: Secondary | ICD-10-CM | POA: Diagnosis not present

## 2020-04-12 DIAGNOSIS — Z13220 Encounter for screening for lipoid disorders: Secondary | ICD-10-CM | POA: Diagnosis not present

## 2020-04-12 DIAGNOSIS — E782 Mixed hyperlipidemia: Secondary | ICD-10-CM

## 2020-04-12 DIAGNOSIS — Z136 Encounter for screening for cardiovascular disorders: Secondary | ICD-10-CM | POA: Diagnosis not present

## 2020-04-12 DIAGNOSIS — Z87891 Personal history of nicotine dependence: Secondary | ICD-10-CM

## 2020-04-12 DIAGNOSIS — N401 Enlarged prostate with lower urinary tract symptoms: Secondary | ICD-10-CM | POA: Diagnosis not present

## 2020-04-12 DIAGNOSIS — Z23 Encounter for immunization: Secondary | ICD-10-CM | POA: Diagnosis not present

## 2020-04-12 DIAGNOSIS — Z1211 Encounter for screening for malignant neoplasm of colon: Secondary | ICD-10-CM

## 2020-04-12 DIAGNOSIS — R5383 Other fatigue: Secondary | ICD-10-CM

## 2020-04-12 DIAGNOSIS — Z0000 Encounter for general adult medical examination without abnormal findings: Secondary | ICD-10-CM

## 2020-04-12 DIAGNOSIS — Z131 Encounter for screening for diabetes mellitus: Secondary | ICD-10-CM

## 2020-04-12 DIAGNOSIS — Z0001 Encounter for general adult medical examination with abnormal findings: Secondary | ICD-10-CM

## 2020-04-13 LAB — MICROALBUMIN / CREATININE URINE RATIO
Creatinine, Urine: 126 mg/dL (ref 20–320)
Microalb Creat Ratio: 10 mcg/mg creat (ref <30)
Microalb, Ur: 1.3 mg/dL

## 2020-04-13 LAB — LIPID PANEL
Cholesterol: 181 mg/dL (ref <200)
HDL: 43 mg/dL (ref >=40)
LDL Cholesterol (Calc): 94 mg/dL (calc)
Non-HDL Cholesterol (Calc): 138 mg/dL (calc) — ABNORMAL HIGH (ref <130)
Total CHOL/HDL Ratio: 4.2 (calc) (ref <5.0)
Triglycerides: 328 mg/dL — ABNORMAL HIGH (ref <150)

## 2020-04-13 LAB — HEMOGLOBIN A1C
Hgb A1c MFr Bld: 5.4 % of total Hgb (ref <5.7)
Mean Plasma Glucose: 108 (calc)
eAG (mmol/L): 6 (calc)

## 2020-04-13 LAB — CBC WITH DIFFERENTIAL/PLATELET
Absolute Monocytes: 915 cells/uL (ref 200–950)
Basophils Absolute: 79 cells/uL (ref 0–200)
Basophils Relative: 0.9 %
Eosinophils Absolute: 326 cells/uL (ref 15–500)
Eosinophils Relative: 3.7 %
HCT: 40.1 % (ref 38.5–50.0)
Hemoglobin: 13.6 g/dL (ref 13.2–17.1)
Lymphs Abs: 2332 cells/uL (ref 850–3900)
MCH: 31.3 pg (ref 27.0–33.0)
MCHC: 33.9 g/dL (ref 32.0–36.0)
MCV: 92.4 fL (ref 80.0–100.0)
MPV: 9.5 fL (ref 7.5–12.5)
Monocytes Relative: 10.4 %
Neutro Abs: 5148 cells/uL (ref 1500–7800)
Neutrophils Relative %: 58.5 %
Platelets: 382 10*3/uL (ref 140–400)
RBC: 4.34 10*6/uL (ref 4.20–5.80)
RDW: 12.6 % (ref 11.0–15.0)
Total Lymphocyte: 26.5 %
WBC: 8.8 10*3/uL (ref 3.8–10.8)

## 2020-04-13 LAB — URINALYSIS, ROUTINE W REFLEX MICROSCOPIC
Bilirubin Urine: NEGATIVE
Glucose, UA: NEGATIVE
Hgb urine dipstick: NEGATIVE
Ketones, ur: NEGATIVE
Leukocytes,Ua: NEGATIVE
Nitrite: NEGATIVE
Protein, ur: NEGATIVE
Specific Gravity, Urine: 1.023 (ref 1.001–1.03)
pH: 5.5 (ref 5.0–8.0)

## 2020-04-13 LAB — COMPLETE METABOLIC PANEL WITH GFR
AG Ratio: 2.1 (calc) (ref 1.0–2.5)
ALT: 20 U/L (ref 9–46)
AST: 20 U/L (ref 10–40)
Albumin: 4.5 g/dL (ref 3.6–5.1)
Alkaline phosphatase (APISO): 75 U/L (ref 36–130)
BUN: 19 mg/dL (ref 7–25)
CO2: 26 mmol/L (ref 20–32)
Calcium: 9.5 mg/dL (ref 8.6–10.3)
Chloride: 106 mmol/L (ref 98–110)
Creat: 1.03 mg/dL (ref 0.60–1.35)
GFR, Est African American: 100 mL/min/{1.73_m2} (ref >=60)
GFR, Est Non African American: 86 mL/min/{1.73_m2} (ref >=60)
Globulin: 2.1 g/dL (calc) (ref 1.9–3.7)
Glucose, Bld: 108 mg/dL — ABNORMAL HIGH (ref 65–99)
Potassium: 4.1 mmol/L (ref 3.5–5.3)
Sodium: 140 mmol/L (ref 135–146)
Total Bilirubin: 0.3 mg/dL (ref 0.2–1.2)
Total Protein: 6.6 g/dL (ref 6.1–8.1)

## 2020-04-13 LAB — MAGNESIUM: Magnesium: 1.9 mg/dL (ref 1.5–2.5)

## 2020-04-13 LAB — VITAMIN B12: Vitamin B-12: 410 pg/mL (ref 200–1100)

## 2020-04-13 LAB — TSH: TSH: 0.71 mIU/L (ref 0.40–4.50)

## 2020-04-13 LAB — TESTOSTERONE: Testosterone: 284 ng/dL (ref 250–827)

## 2020-04-13 LAB — IRON, TOTAL/TOTAL IRON BINDING CAP
%SAT: 23 % (calc) (ref 20–48)
Iron: 92 ug/dL (ref 50–180)
TIBC: 395 mcg/dL (calc) (ref 250–425)

## 2020-04-13 LAB — VITAMIN D 25 HYDROXY (VIT D DEFICIENCY, FRACTURES): Vit D, 25-Hydroxy: 79 ng/mL (ref 30–100)

## 2020-04-13 LAB — PSA: PSA: 0.5 ng/mL (ref <=4.0)

## 2020-04-13 LAB — INSULIN, RANDOM: Insulin: 61.5 u[IU]/mL — ABNORMAL HIGH

## 2020-06-17 ENCOUNTER — Other Ambulatory Visit: Payer: Self-pay | Admitting: Physician Assistant

## 2020-06-17 ENCOUNTER — Other Ambulatory Visit: Payer: Self-pay | Admitting: Internal Medicine

## 2020-06-17 DIAGNOSIS — F341 Dysthymic disorder: Secondary | ICD-10-CM

## 2020-06-17 DIAGNOSIS — F32A Depression, unspecified: Secondary | ICD-10-CM

## 2020-07-11 ENCOUNTER — Other Ambulatory Visit: Payer: Self-pay | Admitting: Adult Health

## 2020-07-11 ENCOUNTER — Other Ambulatory Visit: Payer: Self-pay | Admitting: Internal Medicine

## 2020-07-11 DIAGNOSIS — I10 Essential (primary) hypertension: Secondary | ICD-10-CM

## 2020-07-13 NOTE — Progress Notes (Signed)
FOLLOW UP  Assessment and Plan:    Hypertension At goal; continue medications Monitor blood pressure at home; call if consistently over 130/80 Continue DASH diet.   Reminder to go to the ER if any CP, SOB, nausea, dizziness, severe HA, changes vision/speech, left arm numbness and tingling and jaw pain.  Cholesterol Currently at LDL goal continue crestor;  diet discussed for trigs; suggested fish oil supplement  Continue low cholesterol diet and exercise.  Check lipid panel.   Abnormal glucose Continue diet and exercise.  Perform daily foot/skin check, notify office of any concerning changes.  Check A1C q75m;   Overweight Long discussion about weight loss, diet, and exercise Recommended diet heavy in fruits and veggies and low in animal meats, cheeses, and dairy products, appropriate calorie intake Discussed ideal weight for height  Will follow up in 3 months  Vitamin D Def At goal at last visit; continue supplementation to maintain goal of 70-100 Defer Vit D level  Depression/anxiety Continue medications Lifestyle discussed: diet/exerise, sleep hygiene, stress management, hydration  Tobacco use Discussed risks associated with tobacco use and advised to reduce or quit Patient is ready to do so and plans to quit prior to his birthday with chantix Prescription for chantix with information provided Will follow up at the next visit   Continue diet and meds as discussed. Further disposition pending results of labs. Discussed med's effects and SE's.   Over 30 minutes of exam, counseling, chart review, and critical decision making was performed.   Future Appointments  Date Time Provider Department Center  07/16/2020  4:00 PM Judd Gaudier, NP [facility] None  10/15/2020  4:00 PM Lucky Cowboy, MD [facility] None  04/10/2021  3:00 PM Lucky Cowboy, MD [facility] None     ----------------------------------------------------------------------------------------------------------------------  HPI 49 y.o. male  presents for 3 month follow up on hypertension, cholesterol, prediabetes, weight, anxiety and vitamin D deficiency.   he has a diagnosis of depression/anxiety and is currently on wellburtin ER 300 mg, on lexapro 10mg  and is not on xanax due to work restrictions. He reports is managing fairly, sleeps well.   he currently continues to smoke 1 pack a day, intermittently since 1991; discussed risks associated with smoking, patient is ready to quit.    BMI is Body mass index is 27.84 kg/m., he has been working on diet and exercise. He is doing home exercises, stretches, active job.  Doing his best with diet, takes salads with him to work. Tries to limit fast food, eating out less.  Pushes water, 5-6 bottles daily, 1 sprite/day.  Wt Readings from Last 3 Encounters:  07/16/20 211 lb (95.7 kg)  04/12/20 216 lb 6.4 oz (98.2 kg)  11/24/19 202 lb (91.6 kg)   His blood pressure has been controlled at home, norvasc 10, ziac 10, and benicar 40 1/2 tablet, today their BP is BP: 114/68  He does workout. He denies chest pain, shortness of breath, dizziness.   He is on cholesterol medication (rosuvastatin 20 mg daily) and denies myalgias. His LDL cholesterol is at goal, trigs remain elevated He reports 2-4 beers on the weekend, minimal during the week Denies creamer, high fat daily   The cholesterol last visit was:   Lab Results  Component Value Date   CHOL 181 04/12/2020   HDL 43 04/12/2020   LDLCALC 94 04/12/2020   TRIG 328 (H) 04/12/2020   CHOLHDL 4.2 04/12/2020    He has been working on diet and exercise for hx of prediabetes (5.7% in 2019),  recent insulin June 2021 was 61.5, and denies foot ulcerations, hyperglycemia, increased appetite, nausea, paresthesia of the feet, polydipsia, polyuria, visual disturbances, vomiting and weight loss.  Last A1C in  the office was:  Lab Results  Component Value Date   HGBA1C 5.4 04/12/2020   Patient is on Vitamin D supplement and at goal at recent check:    Lab Results  Component Value Date   VD25OH 79 04/12/2020        Current Medications:    Current Outpatient Medications (Cardiovascular):    amLODipine (NORVASC) 10 MG tablet, Take    1 tablet     Daily     for BP   bisoprolol-hydrochlorothiazide (ZIAC) 10-6.25 MG tablet, Take    1 tablet    Daily     for BP   olmesartan (BENICAR) 40 MG tablet, Take 1 tablet Daily for BP   rosuvastatin (CRESTOR) 40 MG tablet, Take 1 tablet Daily for Cholesterol    Current Outpatient Medications (Hematological):    IRON, FERROUS SULFATE, PO, Take by mouth daily.  Current Outpatient Medications (Other):    buPROPion (WELLBUTRIN XL) 300 MG 24 hr tablet, Take    1 tablet    Daily    for Mood   escitalopram (LEXAPRO) 10 MG tablet, Take 1 tablet     Daily     for Chronic Anxiety   VITAMIN D, CHOLECALCIFEROL, PO, Take 10,000 Units by mouth daily.   Allergies: No Known Allergies   Medical History:  Past Medical History:  Diagnosis Date   Allergy    GERD (gastroesophageal reflux disease)    takes prilosec every day   HTN (hypertension) 08/20/2016   Hyperlipidemia 01/27/2018   Kidney stones    last stone was in 2010   Family history- Reviewed and unchanged Social history- Reviewed and unchanged   Review of Systems:  Review of Systems  Constitutional: Negative for malaise/fatigue and weight loss.  HENT: Negative for hearing loss and tinnitus.   Eyes: Negative for blurred vision and double vision.  Respiratory: Negative for cough, shortness of breath and wheezing.   Cardiovascular: Negative for chest pain, palpitations, orthopnea, claudication and leg swelling.  Gastrointestinal: Negative for abdominal pain, blood in stool, constipation, diarrhea, heartburn, melena, nausea and vomiting.  Genitourinary: Negative.   Musculoskeletal:  Negative for joint pain and myalgias.  Skin: Negative for rash.  Neurological: Negative for dizziness, tingling, sensory change, weakness and headaches.  Endo/Heme/Allergies: Negative for polydipsia.  Psychiatric/Behavioral: Negative for depression, hallucinations, substance abuse and suicidal ideas. The patient is not nervous/anxious and does not have insomnia.   All other systems reviewed and are negative.   Physical Exam: BP 114/68    Pulse 73    Temp (!) 96.8 F (36 C)    Ht 6\' 1"  (1.854 m)    Wt 211 lb (95.7 kg)    SpO2 97%    BMI 27.84 kg/m  Wt Readings from Last 3 Encounters:  07/16/20 211 lb (95.7 kg)  04/12/20 216 lb 6.4 oz (98.2 kg)  11/24/19 202 lb (91.6 kg)   General Appearance: Well nourished, in no apparent distress. Eyes: PERRLA, EOMs, conjunctiva no swelling or erythema Sinuses: No Frontal/maxillary tenderness ENT/Mouth: Ext aud canals clear, TMs without erythema, bulging. No erythema, swelling, or exudate on post pharynx.  Tonsils not swollen or erythematous. Hearing normal.  Neck: Supple, thyroid normal.  Respiratory: Respiratory effort normal, BS equal bilaterally without rales, rhonchi, wheezing or stridor.  Cardio: RRR with no MRGs. Brisk peripheral  pulses without edema.  Abdomen: Soft, + BS.  Non tender, no guarding, rebound, hernias, masses. Lymphatics: Non tender without lymphadenopathy.  Musculoskeletal: Full ROM, 5/5 strength, Normal gait Skin: Warm, dry without rashes, lesions, ecchymosis.  Neuro: Cranial nerves intact. No cerebellar symptoms.  Psych: Awake and oriented X 3, normal affect, Insight and Judgment appropriate.    Dan Maker, NP 3:51 PM [facility]

## 2020-07-16 ENCOUNTER — Other Ambulatory Visit: Payer: Self-pay

## 2020-07-16 ENCOUNTER — Ambulatory Visit (INDEPENDENT_AMBULATORY_CARE_PROVIDER_SITE_OTHER): Payer: BC Managed Care – PPO | Admitting: Adult Health

## 2020-07-16 ENCOUNTER — Encounter: Payer: Self-pay | Admitting: Adult Health

## 2020-07-16 VITALS — BP 114/68 | HR 73 | Temp 96.8°F | Ht 73.0 in | Wt 211.0 lb

## 2020-07-16 DIAGNOSIS — Z79899 Other long term (current) drug therapy: Secondary | ICD-10-CM | POA: Diagnosis not present

## 2020-07-16 DIAGNOSIS — F329 Major depressive disorder, single episode, unspecified: Secondary | ICD-10-CM

## 2020-07-16 DIAGNOSIS — F419 Anxiety disorder, unspecified: Secondary | ICD-10-CM | POA: Diagnosis not present

## 2020-07-16 DIAGNOSIS — I10 Essential (primary) hypertension: Secondary | ICD-10-CM | POA: Diagnosis not present

## 2020-07-16 DIAGNOSIS — E559 Vitamin D deficiency, unspecified: Secondary | ICD-10-CM

## 2020-07-16 DIAGNOSIS — E663 Overweight: Secondary | ICD-10-CM

## 2020-07-16 DIAGNOSIS — R7309 Other abnormal glucose: Secondary | ICD-10-CM | POA: Diagnosis not present

## 2020-07-16 DIAGNOSIS — E782 Mixed hyperlipidemia: Secondary | ICD-10-CM | POA: Diagnosis not present

## 2020-07-16 DIAGNOSIS — F17200 Nicotine dependence, unspecified, uncomplicated: Secondary | ICD-10-CM | POA: Insufficient documentation

## 2020-07-16 LAB — CBC WITH DIFFERENTIAL/PLATELET
Absolute Monocytes: 902 cells/uL (ref 200–950)
Basophils Absolute: 93 cells/uL (ref 0–200)
Basophils Relative: 1 %
Eosinophils Absolute: 251 cells/uL (ref 15–500)
Eosinophils Relative: 2.7 %
HCT: 39.9 % (ref 38.5–50.0)
Hemoglobin: 13.8 g/dL (ref 13.2–17.1)
Lymphs Abs: 2362 cells/uL (ref 850–3900)
MCH: 31.8 pg (ref 27.0–33.0)
MCHC: 34.6 g/dL (ref 32.0–36.0)
MCV: 91.9 fL (ref 80.0–100.0)
MPV: 9.8 fL (ref 7.5–12.5)
Monocytes Relative: 9.7 %
Neutro Abs: 5692 cells/uL (ref 1500–7800)
Neutrophils Relative %: 61.2 %
Platelets: 375 10*3/uL (ref 140–400)
RBC: 4.34 10*6/uL (ref 4.20–5.80)
RDW: 12.2 % (ref 11.0–15.0)
Total Lymphocyte: 25.4 %
WBC: 9.3 10*3/uL (ref 3.8–10.8)

## 2020-07-16 LAB — COMPLETE METABOLIC PANEL WITH GFR
AG Ratio: 2.1 (calc) (ref 1.0–2.5)
ALT: 22 U/L (ref 9–46)
AST: 27 U/L (ref 10–40)
Albumin: 4.7 g/dL (ref 3.6–5.1)
Alkaline phosphatase (APISO): 81 U/L (ref 36–130)
BUN: 14 mg/dL (ref 7–25)
CO2: 26 mmol/L (ref 20–32)
Calcium: 9.6 mg/dL (ref 8.6–10.3)
Chloride: 105 mmol/L (ref 98–110)
Creat: 1.21 mg/dL (ref 0.60–1.35)
GFR, Est African American: 82 mL/min/{1.73_m2} (ref >=60)
GFR, Est Non African American: 71 mL/min/{1.73_m2} (ref >=60)
Globulin: 2.2 g/dL (calc) (ref 1.9–3.7)
Glucose, Bld: 84 mg/dL (ref 65–99)
Potassium: 4.1 mmol/L (ref 3.5–5.3)
Sodium: 141 mmol/L (ref 135–146)
Total Bilirubin: 0.3 mg/dL (ref 0.2–1.2)
Total Protein: 6.9 g/dL (ref 6.1–8.1)

## 2020-07-16 LAB — LIPID PANEL
Cholesterol: 181 mg/dL (ref <200)
HDL: 45 mg/dL (ref >=40)
LDL Cholesterol (Calc): 99 mg/dL (calc)
Non-HDL Cholesterol (Calc): 136 mg/dL (calc) — ABNORMAL HIGH (ref <130)
Total CHOL/HDL Ratio: 4 (calc) (ref <5.0)
Triglycerides: 258 mg/dL — ABNORMAL HIGH (ref <150)

## 2020-07-16 LAB — TSH: TSH: 0.73 mIU/L (ref 0.40–4.50)

## 2020-07-16 LAB — MAGNESIUM: Magnesium: 2.1 mg/dL (ref 1.5–2.5)

## 2020-07-16 MED ORDER — VARENICLINE TARTRATE 1 MG PO TABS: 1.0000 mg | ORAL_TABLET | Freq: Two times a day (BID) | ORAL | 2 refills | Status: DC

## 2020-07-16 NOTE — Patient Instructions (Addendum)
Goals    . Quit Smoking          High Triglycerides Eating Plan Triglycerides are a type of fat in the blood. High levels of triglycerides can increase your risk of heart disease and stroke. If your triglyceride levels are high, choosing the right foods can help lower your triglycerides and keep your heart healthy. Work with your health care provider or a diet and nutrition specialist (dietitian) to develop an eating plan that is right for you. What are tips for following this plan? General guidelines   Lose weight, if you are overweight. For most people, losing 5-10 lbs (2-5 kg) helps lower triglyceride levels. A weight-loss plan may include. ? 30 minutes of exercise at least 5 days a week. ? Reducing the amount of calories, sugar, and fat you eat.  Eat a wide variety of fresh fruits, vegetables, and whole grains. These foods are high in fiber.  Eat foods that contain healthy fats, such as fatty fish, nuts, seeds, and olive oil.  Avoid foods that are high in added sugar, added salt (sodium), saturated fat, and trans fat.  Avoid low-fiber, refined carbohydrates such as white bread, crackers, noodles, and white rice.  Avoid foods with partially hydrogenated oils (trans fats), such as fried foods or stick margarine.  Limit alcohol intake to no more than 1 drink a day for nonpregnant women and 2 drinks a day for men. One drink equals 12 oz of beer, 5 oz of wine, or 1 oz of hard liquor. Your health care provider may recommend that you drink less depending on your overall health. Reading food labels  Check food labels for the amount of saturated fat. Choose foods with no or very little saturated fat.  Check food labels for the amount of trans fat. Choose foods with no trans fat.  Check food labels for the amount of cholesterol. Choose foods low in cholesterol. Ask your dietitian how much cholesterol you should have each day.  Check food labels for the amount of sodium. Choose foods  with less than 140 milligrams (mg) per serving. Shopping  Buy dairy products labeled as nonfat (skim) or low-fat (1%).  Avoid buying processed or prepackaged foods. These are often high in added sugar, sodium, and fat. Cooking  Choose healthy fats when cooking, such as olive oil or canola oil.  Cook foods using lower fat methods, such as baking, broiling, boiling, or grilling.  Make your own sauces, dressings, and marinades when possible, instead of buying them. Store-bought sauces, dressings, and marinades are often high in sodium and sugar. Meal planning  Eat more home-cooked food and less restaurant, buffet, and fast food.  Eat fatty fish at least 2 times each week. Examples of fatty fish include salmon, trout, mackerel, tuna, and herring.  If you eat whole eggs, do not eat more than 3 egg yolks per week. What foods are recommended? The items listed may not be a complete list. Talk with your dietitian about what dietary choices are best for you. Grains Whole wheat or whole grain breads, crackers, cereals, and pasta. Unsweetened oatmeal. Bulgur. Barley. Quinoa. Brown rice. Whole wheat flour tortillas. Vegetables Fresh or frozen vegetables. Low-sodium canned vegetables. Fruits All fresh, canned (in natural juice), or frozen fruits. Meats and other protein foods Skinless chicken or [place]. Ground chicken or [place]. Lean cuts of pork, trimmed of fat. Fish and seafood, especially salmon, trout, and herring. Egg whites. Dried beans, peas, or lentils. Unsalted nuts or seeds. Unsalted canned beans.  Natural peanut or almond butter. Dairy Low-fat dairy products. Skim or low-fat (1%) milk. Reduced fat (2%) and low-sodium cheese. Low-fat ricotta cheese. Low-fat cottage cheese. Plain, low-fat yogurt. Fats and oils Tub margarine without trans fats. Light or reduced-fat mayonnaise. Light or reduced-fat salad dressings. Avocado. Safflower, olive, sunflower, soybean, and canola oils. What foods  are not recommended? The items listed may not be a complete list. Talk with your dietitian about what dietary choices are best for you. Grains White bread. White (regular) pasta. White rice. Cornbread. Bagels. Pastries. Crackers that contain trans fat. Vegetables Creamed or fried vegetables. Vegetables in a cheese sauce. Fruits Sweetened dried fruit. Canned fruit in syrup. Fruit juice. Meats and other protein foods Fatty cuts of meat. Ribs. Chicken wings. Tomasa Blase. Sausage. Bologna. Salami. Chitterlings. Fatback. Hot dogs. Bratwurst. Packaged lunch meats. Dairy Whole or reduced-fat (2%) milk. Half-and-half. Cream cheese. Full-fat or sweetened yogurt. Full-fat cheese. Nondairy creamers. Whipped toppings. Processed cheese or cheese spreads. Cheese curds. Beverages Alcohol. Sweetened drinks, such as soda, lemonade, fruit drinks, or punches. Fats and oils Butter. Stick margarine. Lard. Shortening. Ghee. Bacon fat. Tropical oils, such as coconut, palm kernel, or palm oils. Sweets and desserts Corn syrup. Sugars. Honey. Molasses. Candy. Jam and jelly. Syrup. Sweetened cereals. Cookies. Pies. Cakes. Donuts. Muffins. Ice cream. Condiments Store-bought sauces, dressings, and marinades that are high in sugar, such as ketchup and barbecue sauce. Summary  High levels of triglycerides can increase the risk of heart disease and stroke. Choosing the right foods can help lower your triglycerides.  Eat plenty of fresh fruits, vegetables, and whole grains. Choose low-fat dairy and lean meats. Eat fatty fish at least twice a week.  Avoid processed and prepackaged foods with added sugar, sodium, saturated fat, and trans fat.  If you need suggestions or have questions about what types of food are good for you, talk with your health care provider or a dietitian. This information is not intended to replace advice given to you by your health care provider. Make sure you discuss any questions you have with your  health care provider. Document Revised: 09/18/2017 Document Reviewed: 12/09/2016 Elsevier Patient Education  2020 Elsevier Inc.      SMOKING CESSATION WITH [place]  [employer]  ([phone] [phone] for more information or for a free program for smoking cessation help.   You can call QUIT SMART 1-800-QUIT-NOW for free nicotine patches or replacement therapy- if they are out- keep calling  [facility] Can call for smoking cessation classes, [phone] 644-0270  If you have a smart phone, please look up Smoke Free app, this will help you stay on track and give you information about money you have saved, life that you have gained back and a ton of more information.   We are giving you chantix for smoking cessation. You can do it! And we are here to help! You may have heard some scary side effects about chantix, the three most common I hear about are nausea, crazy dreams and depression.  However, I like for my patients to try to stay on 1/2 a tablet twice a day rather than one tablet twice a day as normally prescribed. This helps decrease the chances of side effects and helps save money by making a one month prescription last two months  Please start the prescription this way:  Start 1/2 tablet by mouth once daily after food with a full glass of water for 3 days Then do 1/2 tablet by mouth twice daily for 4  days. During this first week you can smoke, but try to stop after this week.  At this point we have several options: 1) continue on 1/2 tablet twice a day- which I encourage you to do. You can stay on this dose the rest of the time on the medication or if you still feel the need to smoke you can do one of the two options below. 2) do one tablet in the morning and 1/2 in the evening which helps decrease dreams. 3) do one tablet twice a day.   What if I miss a dose? If you miss a dose, take it as soon as you can. If it is almost time for your next dose, take only that dose.  Do not take double or extra doses.  What should I watch for while using this medicine? Visit your doctor or health care professional for regular check ups. Ask for ongoing advice and encouragement from your doctor or healthcare professional, friends, and family to help you quit. If you smoke while on this medication, quit again  Your mouth may get dry. Chewing sugarless gum or hard candy, and drinking plenty of water may help. Contact your doctor if the problem does not go away or is severe.  You may get drowsy or dizzy. Do not drive, use machinery, or do anything that needs mental alertness until you know how this medicine affects you. Do not stand or sit up quickly, especially if you are an older patient.   The use of this medicine may increase the chance of suicidal thoughts or actions. Pay special attention to how you are responding while on this medicine. Any worsening of mood, or thoughts of suicide or dying should be reported to your health care professional right away.  ADVANTAGES OF QUITTING SMOKING  Within 20 minutes, blood pressure decreases. Your pulse is at normal level.  After 8 hours, carbon monoxide levels in the blood return to normal. Your oxygen level increases.  After 24 hours, the chance of having a heart attack starts to decrease. Your breath, hair, and body stop smelling like smoke.  After 48 hours, damaged nerve endings begin to recover. Your sense of taste and smell improve.  After 72 hours, the body is virtually free of nicotine. Your bronchial tubes relax and breathing becomes easier.  After 2 to 12 weeks, lungs can hold more air. Exercise becomes easier and circulation improves.  After 1 year, the risk of coronary heart disease is cut in half.  After 5 years, the risk of stroke falls to the same as a nonsmoker.  After 10 years, the risk of lung cancer is cut in half and the risk of other cancers decreases significantly.  After 15 years, the risk of coronary  heart disease drops, usually to the level of a nonsmoker.  You will have extra money to spend on things other than cigarettes.

## 2020-08-23 ENCOUNTER — Other Ambulatory Visit: Payer: Self-pay | Admitting: Internal Medicine

## 2020-08-23 DIAGNOSIS — F341 Dysthymic disorder: Secondary | ICD-10-CM

## 2020-08-23 DIAGNOSIS — F419 Anxiety disorder, unspecified: Secondary | ICD-10-CM

## 2020-08-23 DIAGNOSIS — F32A Depression, unspecified: Secondary | ICD-10-CM

## 2020-09-16 ENCOUNTER — Other Ambulatory Visit: Payer: Self-pay | Admitting: Internal Medicine

## 2020-09-16 DIAGNOSIS — I10 Essential (primary) hypertension: Secondary | ICD-10-CM

## 2020-10-15 ENCOUNTER — Ambulatory Visit: Payer: BC Managed Care – PPO | Admitting: Internal Medicine

## 2020-10-29 NOTE — Progress Notes (Signed)
History of Present Illness:       This very nice 50 y.o.  [name] presents for 3 month follow up with HTN, HLD, Pre-Diabetes and Vitamin D Deficiency.       Patient is treated for HTN  (2017) & BP has been controlled at home. Today's BP is at goal -  114/76. Patient has had no complaints of any cardiac type chest pain, palpitations, dyspnea / orthopnea / PND, dizziness, claudication or dependent edema.      Hyperlipidemia is controlled with diet & meds. Patient denies myalgias or other med SE's. Last Lipids were at goal except e;levated Trig's:  Lab Results  Component Value Date   CHOL 181 07/16/2020   HDL 45 07/16/2020   LDLCALC 99 07/16/2020   TRIG 258 (H) 07/16/2020   CHOLHDL 4.0 07/16/2020    Also, the patient has history of PreDiabetes (A1c 5.7% /2019)and has had no symptoms of reactive hypoglycemia, diabetic polys, paresthesias or visual blurring.  Last A1c was normal & at goal:  Lab Results  Component Value Date   HGBA1C 5.4 04/12/2020           Further, the patient also has history of Vitamin D Deficiency ("24" /2017) and supplements vitamin D without any suspected side-effects. Last vitamin D was at goal:  Lab Results  Component Value Date   VD25OH 79 04/12/2020    Current Outpatient Medications on File Prior to Visit  Medication Sig  .  amLODipine  10 MG tablet TAKE 1 TABLET  DAILY   . bisoprolol-hctz 0-6.25 MG  TAKE 1 TABLET   DAILY   . buPROPion-XL 300 MG  TAKE 1 TABLET  DAILY   . escitalopram  10 MG tablet TAKE 1 TABLET   DAILY   . FERROUS SULFATE Take  daily.  Marland Kitchen olmesartan  40 MG tablet Take 1 tablet Daily   . rosuvastatin  40 MG tablet Take 1 tablet Daily   . VITAMIN D Take 10,000 Units  daily.    No Known Allergies  PMHx:   Past Medical History:  Diagnosis Date  . Allergy   . GERD (gastroesophageal reflux disease)    takes prilosec every day  . HTN (hypertension) 08/20/2016  . Hyperlipidemia 01/27/2018  . Kidney stones    last stone was in  2010    Immunization History  Administered Date(s) Administered  . PPD Test 04/12/2020  . Tdap 04/12/2020    Past Surgical History:  Procedure Laterality Date  . ANTERIOR CRUCIATE LIGAMENT REPAIR Left   . CHEST TUBE INSERTION    . LITHOTRIPSY    . WISDOM TOOTH EXTRACTION      FHx:    Reviewed / unchanged  SHx:    Reviewed / unchanged   Systems Review:  Constitutional: Denies fever, chills, wt changes, headaches, insomnia, fatigue, night sweats, change in appetite. Eyes: Denies redness, blurred vision, diplopia, discharge, itchy, watery eyes.  ENT: Denies discharge, congestion, post nasal drip, epistaxis, sore throat, earache, hearing loss, dental pain, tinnitus, vertigo, sinus pain, snoring.  CV: Denies chest pain, palpitations, irregular heartbeat, syncope, dyspnea, diaphoresis, orthopnea, PND, claudication or edema. Respiratory: denies cough, dyspnea, DOE, pleurisy, hoarseness, laryngitis, wheezing.  Gastrointestinal: Denies dysphagia, odynophagia, heartburn, reflux, water brash, abdominal pain or cramps, nausea, vomiting, bloating, diarrhea, constipation, hematemesis, melena, hematochezia  or hemorrhoids. Genitourinary: Denies dysuria, frequency, urgency, nocturia, hesitancy, discharge, hematuria or flank pain. Musculoskeletal: Denies arthralgias, myalgias, stiffness, jt. swelling, pain, limping or strain/sprain.  Skin: Denies  pruritus, rash, hives, warts, acne, eczema or change in skin lesion(s). Neuro: No weakness, tremor, incoordination, spasms, paresthesia or pain. Psychiatric: Denies confusion, memory loss or sensory loss. Endo: Denies change in weight, skin or hair change.  Heme/Lymph: No excessive bleeding, bruising or enlarged lymph nodes.  Physical Exam  BP 114/76   Pulse 77   Temp 97.6 F (36.4 C)   Resp 16   Ht 6\' 1"  (1.854 m)   Wt 214 lb (97.1 kg)   SpO2 95%   BMI 28.23 kg/m   Appears  well nourished, well groomed  and in no distress.  Eyes: PERRLA,  EOMs, conjunctiva no swelling or erythema. Sinuses: No frontal/maxillary tenderness ENT/Mouth: EAC's clear, TM's nl w/o erythema, bulging. Nares clear w/o erythema, swelling, exudates. Oropharynx clear without erythema or exudates. Oral hygiene is good. Tongue normal, non obstructing. Hearing intact.  Neck: Supple. Thyroid not palpable. Car 2+/2+ without bruits, nodes or JVD. Chest: Respirations nl with BS clear & equal w/o rales, rhonchi, wheezing or stridor.  Cor: Heart sounds normal w/ regular rate and rhythm without sig. murmurs, gallops, clicks or rubs. Peripheral pulses normal and equal  without edema.  Abdomen: Soft & bowel sounds normal. Non-tender w/o guarding, rebound, hernias, masses or organomegaly.  Lymphatics: Unremarkable.  Musculoskeletal: Full ROM all peripheral extremities, joint stability, 5/5 strength and normal gait.  Skin: Warm, dry without exposed rashes, lesions or ecchymosis apparent.  Neuro: Cranial nerves intact, reflexes equal bilaterally. Sensory-motor testing grossly intact. Tendon reflexes grossly intact.  Pysch: Alert & oriented x 3.  Insight and judgement nl & appropriate. No ideations.  Assessment and Plan:  1. Essential hypertension  - Continue medication, monitor blood pressure at home.  - Continue DASH diet.  Reminder to go to the ER if any CP,  SOB, nausea, dizziness, severe HA, changes vision/speech.  - CBC with Differential/Platelet - COMPLETE METABOLIC PANEL WITH GFR - Magnesium - TSH  2. Hyperlipidemia, mixed  - Continue diet/meds, exercise,& lifestyle modifications.  - Continue monitor periodic cholesterol/liver & renal functions   - Lipid panel - TSH  3. Abnormal glucose  - Continue diet, exercise  - Lifestyle modifications.  - Monitor appropriate labs.  - Hemoglobin A1c - Insulin, random  4. Vitamin D deficiency  - Continue supplementation.  - VITAMIN D 25 Hydroxy   5. Medication management  - CBC with  Differential/Platelet - COMPLETE METABOLIC PANEL WITH GFR - Magnesium - Lipid panel - TSH - Hemoglobin A1c - Insulin, random - VITAMIN D 25 Hydroxy         Discussed  regular exercise, BP monitoring, weight control to achieve/maintain BMI less than 25 and discussed med and SE's. Recommended labs to assess and monitor clinical status with further disposition pending results of labs.  I discussed the assessment and treatment plan with the patient. The patient was provided an opportunity to ask questions and all were answered. The patient agreed with the plan and demonstrated an understanding of the instructions.  I provided over 30 minutes of exam, counseling, chart review and  complex critical decision making.         The patient was advised to call back or seek an in-person evaluation if the symptoms worsen or if the condition fails to improve as anticipated.   , MD

## 2020-10-30 ENCOUNTER — Ambulatory Visit (INDEPENDENT_AMBULATORY_CARE_PROVIDER_SITE_OTHER): Payer: BC Managed Care – PPO | Admitting: Internal Medicine

## 2020-10-30 ENCOUNTER — Other Ambulatory Visit: Payer: Self-pay

## 2020-10-30 ENCOUNTER — Encounter: Payer: Self-pay | Admitting: Internal Medicine

## 2020-10-30 VITALS — BP 114/76 | HR 77 | Temp 97.6°F | Resp 16 | Ht 73.0 in | Wt 214.0 lb

## 2020-10-30 DIAGNOSIS — R7309 Other abnormal glucose: Secondary | ICD-10-CM

## 2020-10-30 DIAGNOSIS — E559 Vitamin D deficiency, unspecified: Secondary | ICD-10-CM

## 2020-10-30 DIAGNOSIS — E782 Mixed hyperlipidemia: Secondary | ICD-10-CM | POA: Diagnosis not present

## 2020-10-30 DIAGNOSIS — I10 Essential (primary) hypertension: Secondary | ICD-10-CM | POA: Diagnosis not present

## 2020-10-30 DIAGNOSIS — Z79899 Other long term (current) drug therapy: Secondary | ICD-10-CM | POA: Diagnosis not present

## 2020-10-30 NOTE — Patient Instructions (Signed)

## 2020-10-31 LAB — COMPLETE METABOLIC PANEL WITH GFR
AG Ratio: 2.2 (calc) (ref 1.0–2.5)
ALT: 21 U/L (ref 9–46)
AST: 27 U/L (ref 10–40)
Albumin: 4.4 g/dL (ref 3.6–5.1)
Alkaline phosphatase (APISO): 80 U/L (ref 36–130)
BUN: 13 mg/dL (ref 7–25)
CO2: 22 mmol/L (ref 20–32)
Calcium: 9.2 mg/dL (ref 8.6–10.3)
Chloride: 109 mmol/L (ref 98–110)
Creat: 1.1 mg/dL (ref 0.60–1.35)
GFR, Est African American: 92 mL/min/{1.73_m2} (ref >=60)
GFR, Est Non African American: 79 mL/min/{1.73_m2} (ref >=60)
Globulin: 2 g/dL (calc) (ref 1.9–3.7)
Glucose, Bld: 117 mg/dL — ABNORMAL HIGH (ref 65–99)
Potassium: 3.9 mmol/L (ref 3.5–5.3)
Sodium: 142 mmol/L (ref 135–146)
Total Bilirubin: 0.2 mg/dL (ref 0.2–1.2)
Total Protein: 6.4 g/dL (ref 6.1–8.1)

## 2020-10-31 LAB — LIPID PANEL
Cholesterol: 175 mg/dL (ref <200)
HDL: 40 mg/dL (ref >=40)
LDL Cholesterol (Calc): 95 mg/dL (calc)
Non-HDL Cholesterol (Calc): 135 mg/dL (calc) — ABNORMAL HIGH (ref <130)
Total CHOL/HDL Ratio: 4.4 (calc) (ref <5.0)
Triglycerides: 280 mg/dL — ABNORMAL HIGH (ref <150)

## 2020-10-31 LAB — CBC WITH DIFFERENTIAL/PLATELET
Absolute Monocytes: 998 cells/uL — ABNORMAL HIGH (ref 200–950)
Basophils Absolute: 86 cells/uL (ref 0–200)
Basophils Relative: 0.9 %
Eosinophils Absolute: 352 cells/uL (ref 15–500)
Eosinophils Relative: 3.7 %
HCT: 39.7 % (ref 38.5–50.0)
Hemoglobin: 13.8 g/dL (ref 13.2–17.1)
Lymphs Abs: 2660 cells/uL (ref 850–3900)
MCH: 31.9 pg (ref 27.0–33.0)
MCHC: 34.8 g/dL (ref 32.0–36.0)
MCV: 91.7 fL (ref 80.0–100.0)
MPV: 9.7 fL (ref 7.5–12.5)
Monocytes Relative: 10.5 %
Neutro Abs: 5406 cells/uL (ref 1500–7800)
Neutrophils Relative %: 56.9 %
Platelets: 397 10*3/uL (ref 140–400)
RBC: 4.33 10*6/uL (ref 4.20–5.80)
RDW: 11.9 % (ref 11.0–15.0)
Total Lymphocyte: 28 %
WBC: 9.5 10*3/uL (ref 3.8–10.8)

## 2020-10-31 LAB — VITAMIN D 25 HYDROXY (VIT D DEFICIENCY, FRACTURES): Vit D, 25-Hydroxy: 69 ng/mL (ref 30–100)

## 2020-10-31 LAB — HEMOGLOBIN A1C
Hgb A1c MFr Bld: 5.5 % of total Hgb (ref <5.7)
Mean Plasma Glucose: 111 mg/dL
eAG (mmol/L): 6.2 mmol/L

## 2020-10-31 LAB — INSULIN, RANDOM: Insulin: 87.3 u[IU]/mL — ABNORMAL HIGH

## 2020-10-31 LAB — TSH: TSH: 0.55 mIU/L (ref 0.40–4.50)

## 2020-10-31 LAB — MAGNESIUM: Magnesium: 2 mg/dL (ref 1.5–2.5)

## 2020-10-31 NOTE — Progress Notes (Signed)
========================================================== -   Test results slightly outside the reference range are not unusual. If there is anything important, I will review this with you,  otherwise it is considered normal test values.  If you have further questions,  please do not hesitate to contact me at the office or via My Chart.  ========================================================== ==========================================================  -  Total Chol = 175 and LDL Chol = 95 - Both  Excellent   - Very low risk for Heart Attack  / Stroke ========================================================  - But . . . . .  Triglycerides (   280   ) or fats in blood are too high  (goal is less than 150)    - Recommend avoid fried & greasy foods,  sweets / candy,   - Avoid white rice  (brown or wild rice or Quinoa is OK),   - Avoid white potatoes  (sweet potatoes are OK)   - Avoid anything made from white flour  - bagels, doughnuts, rolls, buns, biscuits, white and   wheat breads, pizza crust and traditional  pasta made of white flour & egg white  - (vegetarian pasta or spinach or wheat pasta is OK).    - Multi-grain bread is OK - like multi-grain flat bread or  sandwich thins.   - Avoid alcohol in excess.   - Exercise is also important. ==========================================================  -  A1c -is Normal - Great - No Diabetes  !  ==========================================================  -  Vitamin D = 69 - Excellent  ==========================================================  -  All Else - CBC - Kidneys - Electrolytes - Liver - Magnesium & Thyroid    - all  Normal / OK ===========================================================  ===========================================================

## 2021-02-08 ENCOUNTER — Other Ambulatory Visit: Payer: Self-pay | Admitting: Internal Medicine

## 2021-02-08 DIAGNOSIS — E782 Mixed hyperlipidemia: Secondary | ICD-10-CM

## 2021-02-08 DIAGNOSIS — I10 Essential (primary) hypertension: Secondary | ICD-10-CM

## 2021-04-10 ENCOUNTER — Encounter: Payer: BC Managed Care – PPO | Admitting: Internal Medicine

## 2021-05-29 ENCOUNTER — Encounter: Payer: BC Managed Care – PPO | Admitting: Internal Medicine

## 2021-09-11 ENCOUNTER — Other Ambulatory Visit: Payer: Self-pay | Admitting: Adult Health

## 2021-09-11 DIAGNOSIS — F32A Depression, unspecified: Secondary | ICD-10-CM

## 2021-09-11 DIAGNOSIS — F419 Anxiety disorder, unspecified: Secondary | ICD-10-CM

## 2021-09-11 DIAGNOSIS — F341 Dysthymic disorder: Secondary | ICD-10-CM

## 2021-10-03 ENCOUNTER — Other Ambulatory Visit: Payer: Self-pay | Admitting: Nurse Practitioner

## 2021-10-03 DIAGNOSIS — F341 Dysthymic disorder: Secondary | ICD-10-CM

## 2021-10-03 DIAGNOSIS — F419 Anxiety disorder, unspecified: Secondary | ICD-10-CM

## 2021-10-08 ENCOUNTER — Ambulatory Visit: Payer: BC Managed Care – PPO | Admitting: Internal Medicine

## 2021-10-08 DIAGNOSIS — M25562 Pain in left knee: Secondary | ICD-10-CM | POA: Diagnosis not present

## 2021-10-09 ENCOUNTER — Other Ambulatory Visit: Payer: Self-pay | Admitting: Nurse Practitioner

## 2021-10-09 DIAGNOSIS — F341 Dysthymic disorder: Secondary | ICD-10-CM

## 2021-10-09 DIAGNOSIS — F32A Depression, unspecified: Secondary | ICD-10-CM

## 2021-10-09 DIAGNOSIS — F419 Anxiety disorder, unspecified: Secondary | ICD-10-CM

## 2021-10-09 NOTE — Telephone Encounter (Signed)
Please schedule an appointment- 6 month f/u

## 2021-12-27 ENCOUNTER — Other Ambulatory Visit: Payer: Self-pay

## 2021-12-27 ENCOUNTER — Encounter: Payer: Self-pay | Admitting: Nurse Practitioner

## 2021-12-27 ENCOUNTER — Ambulatory Visit (INDEPENDENT_AMBULATORY_CARE_PROVIDER_SITE_OTHER): Payer: BC Managed Care – PPO | Admitting: Nurse Practitioner

## 2021-12-27 VITALS — BP 160/104 | HR 74 | Temp 97.7°F | Resp 14 | Ht 73.0 in | Wt 201.4 lb

## 2021-12-27 DIAGNOSIS — F32A Depression, unspecified: Secondary | ICD-10-CM

## 2021-12-27 DIAGNOSIS — F17200 Nicotine dependence, unspecified, uncomplicated: Secondary | ICD-10-CM

## 2021-12-27 DIAGNOSIS — E782 Mixed hyperlipidemia: Secondary | ICD-10-CM

## 2021-12-27 DIAGNOSIS — Z23 Encounter for immunization: Secondary | ICD-10-CM

## 2021-12-27 DIAGNOSIS — F341 Dysthymic disorder: Secondary | ICD-10-CM | POA: Diagnosis not present

## 2021-12-27 DIAGNOSIS — R5383 Other fatigue: Secondary | ICD-10-CM

## 2021-12-27 DIAGNOSIS — F419 Anxiety disorder, unspecified: Secondary | ICD-10-CM

## 2021-12-27 DIAGNOSIS — I10 Essential (primary) hypertension: Secondary | ICD-10-CM

## 2021-12-27 DIAGNOSIS — Z833 Family history of diabetes mellitus: Secondary | ICD-10-CM

## 2021-12-27 DIAGNOSIS — Z1211 Encounter for screening for malignant neoplasm of colon: Secondary | ICD-10-CM

## 2021-12-27 LAB — LIPID PANEL
Cholesterol: 153 mg/dL (ref 0–200)
HDL: 41.4 mg/dL (ref >39.00)
LDL Cholesterol: 85 mg/dL (ref 0–99)
NonHDL: 111.79
Total CHOL/HDL Ratio: 4
Triglycerides: 133 mg/dL (ref 0.0–149.0)
VLDL: 26.6 mg/dL (ref 0.0–40.0)

## 2021-12-27 LAB — CBC
HCT: 41 % (ref 39.0–52.0)
Hemoglobin: 13.9 g/dL (ref 13.0–17.0)
MCHC: 33.9 g/dL (ref 30.0–36.0)
MCV: 91.9 fl (ref 78.0–100.0)
Platelets: 379 10*3/uL (ref 150.0–400.0)
RBC: 4.46 Mil/uL (ref 4.22–5.81)
RDW: 13.2 % (ref 11.5–15.5)
WBC: 8 10*3/uL (ref 4.0–10.5)

## 2021-12-27 LAB — COMPREHENSIVE METABOLIC PANEL
ALT: 18 U/L (ref 0–53)
AST: 22 U/L (ref 0–37)
Albumin: 4.6 g/dL (ref 3.5–5.2)
Alkaline Phosphatase: 74 U/L (ref 39–117)
BUN: 13 mg/dL (ref 6–23)
CO2: 28 mEq/L (ref 19–32)
Calcium: 9.7 mg/dL (ref 8.4–10.5)
Chloride: 107 mEq/L (ref 96–112)
Creatinine, Ser: 0.91 mg/dL (ref 0.40–1.50)
GFR: 99.15 mL/min (ref >60.00)
Glucose, Bld: 78 mg/dL (ref 70–99)
Potassium: 3.9 mEq/L (ref 3.5–5.1)
Sodium: 143 mEq/L (ref 135–145)
Total Bilirubin: 0.3 mg/dL (ref 0.2–1.2)
Total Protein: 6.4 g/dL (ref 6.0–8.3)

## 2021-12-27 LAB — TSH: TSH: 0.65 u[IU]/mL (ref 0.35–5.50)

## 2021-12-27 LAB — HEMOGLOBIN A1C: Hgb A1c MFr Bld: 6.3 % (ref 4.6–6.5)

## 2021-12-27 MED ORDER — ROSUVASTATIN CALCIUM 40 MG PO TABS: ORAL_TABLET | ORAL | 3 refills | Status: DC

## 2021-12-27 MED ORDER — OLMESARTAN MEDOXOMIL 40 MG PO TABS: ORAL_TABLET | ORAL | 3 refills | Status: DC

## 2021-12-27 MED ORDER — AMLODIPINE BESYLATE 10 MG PO TABS
ORAL_TABLET | ORAL | 3 refills | Status: DC
Start: 2021-12-27 — End: 2022-02-24

## 2021-12-27 MED ORDER — BUPROPION HCL ER (XL) 150 MG PO TB24: 150.0000 mg | ORAL_TABLET | Freq: Every day | ORAL | 0 refills | Status: DC

## 2021-12-27 MED ORDER — BISOPROLOL-HYDROCHLOROTHIAZIDE 10-6.25 MG PO TABS: ORAL_TABLET | ORAL | 3 refills | Status: DC

## 2021-12-27 MED ORDER — ESCITALOPRAM OXALATE 10 MG PO TABS: ORAL_TABLET | ORAL | 11 refills | Status: DC

## 2021-12-27 NOTE — Patient Instructions (Signed)
Nice to see you today ?Check your blood pressure once a day for the next week and send me the readings on MyChart ?With the Wellbutrin (buproprion) we are going to do 150mg  for a month and then can step you up to the 300mg  after that ?I will start you back on the lexapro 10mg . If you have too much of an upset stomach (nausea, vomiting, diarrhea) half the tablet for a week then go back to the whole 10mg . ? ?Follow up in 6 months for a physical  ?

## 2021-12-27 NOTE — Assessment & Plan Note (Signed)
Patient is current everyday smoker.  He has quit in the past using the cold [place] method.  Smoking cessation. ?

## 2021-12-27 NOTE — Progress Notes (Signed)
New Patient Office Visit  Subjective:  Patient ID: Joshua Rivers, male    DOB: 15-Mar-1971  Age: 50 y.o. MRN: [id number]  CC:  Chief Complaint  Patient presents with   Establish Care    Previous PCP Dr Melford Aase    HPI Joshua Rivers presents for    HTN: Can check blood pressure at home. When he was stable he was not checking. Would get headache, light headed and dizzy. Walks with the dog 3 times a week at 30-1 hour at a time  Anxiety and depression: Wellbutrin and lexapro. Has been out of the medication for a few weeks. Was doing well. No HI/SI/AVH  HLD: Crestor tolerated it well. See above for exercise  Smoker: 0.5 ppd for the past 30 years. States he recently started back after being quit for a year. Did cold [place] the last time  Vit D: States that he is currently taking the vitamin D over the counter  Immunizations: -Tetanus: 2021 -Influenza: up date today -Covid-19: Refused -Shingles: NA -Pneumonia: NA  -HPV: Age-  Exercise: No regular exercise. See above   Colonoscopy: Ordered today for [facility]  Lung Cancer Screening: NA so far  Sleep: 6-7 hours at night and does not feel rested Past Medical History:  Diagnosis Date   Allergy    GERD (gastroesophageal reflux disease)    takes prilosec every day   HTN (hypertension) 08/20/2016   Hyperlipidemia 01/27/2018   Kidney stones    last stone was in 2010    Past Surgical History:  Procedure Laterality Date   ANTERIOR CRUCIATE LIGAMENT REPAIR Left    CHEST TUBE INSERTION     LITHOTRIPSY     WISDOM TOOTH EXTRACTION      Family History  Problem Relation Age of Onset   Hypertension Mother    Stroke Mother    Diabetes Mother    Heart disease Father 81       pacemaker and MI   Diabetes Father    Hypertension Sister     Social History   Socioeconomic History   Marital status: Single    Spouse name: Not on file   Number of children: 0   Years of education: Not on file   Highest education level: Not  on file  Occupational History   Not on file  Tobacco Use   Smoking status: Every Day    Packs/day: 0.50    Years: 30.00    Pack years: 15.00    Types: Cigarettes    Start date: 56   Smokeless tobacco: Never  Vaping Use   Vaping Use: Never used  Substance and Sexual Activity   Alcohol use: Not Currently   Drug use: No   Sexual activity: Not Currently  Other Topics Concern   Not on file  Social History Narrative   Full Time: Driver [employer]      Hobbies:Mechanical work   Social Determinants of Radio broadcast assistant Strain: Not on file  Food Insecurity: Not on file  Transportation Needs: Not on file  Physical Activity: Not on file  Stress: Not on file  Social Connections: Not on file  Intimate Partner Violence: Not on file    ROS Review of Systems  Constitutional:  Positive for fatigue. Negative for chills and fever.  Respiratory:  Negative for cough and shortness of breath.   Cardiovascular:  Negative for chest pain and leg swelling.  Gastrointestinal:  Negative for abdominal pain, constipation, diarrhea, nausea and vomiting.  BM every other day   Genitourinary:  Negative for difficulty urinating, frequency and hematuria.       Trouble emptying bladder out  Nocturia "+"  Neurological:  Negative for dizziness, light-headedness and headaches.  Psychiatric/Behavioral:  Negative for hallucinations and suicidal ideas.    Objective:   Today's Vitals: BP (!) 160/104    Pulse 74    Temp 97.7 F (36.5 C)    Resp 14    Ht 6\' 1"  (1.854 m)    Wt 201 lb 6 oz (91.3 kg)    SpO2 96%    BMI 26.57 kg/m   Physical Exam Constitutional:      Appearance: Normal appearance.  HENT:     Right Ear: Tympanic membrane, ear canal and external ear normal.     Left Ear: Tympanic membrane, ear canal and external ear normal.     Mouth/Throat:     Mouth: Mucous membranes are moist.     Pharynx: Oropharynx is clear.  Eyes:     Extraocular Movements: Extraocular movements intact.      Pupils: Pupils are equal, round, and reactive to light.  Neck:     Thyroid: No thyroid mass, thyromegaly or thyroid tenderness.  Cardiovascular:     Rate and Rhythm: Normal rate and regular rhythm.     Pulses: Normal pulses.     Heart sounds: Normal heart sounds.  Pulmonary:     Effort: Pulmonary effort is normal.     Breath sounds: Normal breath sounds.  Abdominal:     General: Bowel sounds are normal. There is no distension.     Palpations: There is no mass.     Tenderness: There is no abdominal tenderness.     Hernia: No hernia is present.  Lymphadenopathy:     Cervical: No cervical adenopathy.  Neurological:     General: No focal deficit present.     Mental Status: He is alert.     Deep Tendon Reflexes:     Reflex Scores:      Bicep reflexes are 2+ on the right side and 2+ on the left side.      Patellar reflexes are 2+ on the right side and 2+ on the left side.    Comments: Bilateral upper and lower extremity strength 5/5  Psychiatric:        Mood and Affect: Mood normal.        Behavior: Behavior normal.        Thought Content: Thought content normal.        Judgment: Judgment normal.    Assessment & Plan:   Problem List Items Addressed This Visit       Cardiovascular and Mediastinum   Essential hypertension    She has been on medication for several weeks.  Patient has been maintained on amlodipine, losartan, bisoprolol-hydrochlorothiazide.  Blood pressure elevated in office today.  Patient to check blood pressure at home daily for the next week and report via MyChart.  Will start medications back as listed above      Relevant Medications   amLODipine (NORVASC) 10 MG tablet   bisoprolol-hydrochlorothiazide (ZIAC) 10-6.25 MG tablet   olmesartan (BENICAR) 40 MG tablet   rosuvastatin (CRESTOR) 40 MG tablet   Other Relevant Orders   CBC (Completed)   Comprehensive metabolic panel (Completed)     Other   Anxiety and depression    Patient was maintained on  Wellbutrin 300 mg in 10 mg.  Patient has also been off medications for  several weeks.  We will start patient back on Lexapro 10 mg nightly.  We will start patient back on bupropion 150 mg daily for 1 month, after 1 month we will switch him up to 300 mg like he was.  Did instruct patient if he has a hard time tolerating Lexapro 10 mg nightly he may affect.  May take half a tablet for 1 week then titrate back up to 1 tablet daily.  No HI/SI/AVH.      Relevant Medications   escitalopram (LEXAPRO) 10 MG tablet   buPROPion (WELLBUTRIN XL) 150 MG 24 hr tablet   Hyperlipidemia, mixed    She was maintained on rosuvastatin 40 mg.  We will continue medication for patient pending lipid panel in office today.      Relevant Medications   amLODipine (NORVASC) 10 MG tablet   bisoprolol-hydrochlorothiazide (ZIAC) 10-6.25 MG tablet   olmesartan (BENICAR) 40 MG tablet   rosuvastatin (CRESTOR) 40 MG tablet   Other Relevant Orders   Lipid panel (Completed)   Smoker    Patient is current everyday smoker.  He has quit in the past using the cold [place] method.  Smoking cessation.      Other fatigue - Primary   Relevant Orders   TSH (Completed)   Other Visit Diagnoses     Dysthymia       Relevant Medications   escitalopram (LEXAPRO) 10 MG tablet   buPROPion (WELLBUTRIN XL) 150 MG 24 hr tablet   Family history of diabetes mellitus       Relevant Orders   Hemoglobin A1c (Completed)   Need for immunization against influenza       Relevant Orders   Flu Vaccine QUAD 6+ mos PF IM (Fluarix Quad PF) (Completed)   Screening for colon cancer       Relevant Orders   Ambulatory referral to Gastroenterology       Outpatient Encounter Medications as of 12/27/2021  Medication Sig   buPROPion (WELLBUTRIN XL) 150 MG 24 hr tablet Take 1 tablet (150 mg total) by mouth daily.   IRON, FERROUS SULFATE, PO Take by mouth daily.   VITAMIN D, CHOLECALCIFEROL, PO Take 10,000 Units by mouth daily.   [DISCONTINUED]  amLODipine (NORVASC) 10 MG tablet TAKE 1 TABLET BY MOUTH  DAILY FOR BLOOD PRESSURE   [DISCONTINUED] bisoprolol-hydrochlorothiazide (ZIAC) 10-6.25 MG tablet TAKE 1 TABLET BY MOUTH  DAILY FOR BLOOD PRESSURE   [DISCONTINUED] buPROPion (WELLBUTRIN XL) 300 MG 24 hr tablet TAKE 1 TABLET BY MOUTH DAILY FOR MOOD   [DISCONTINUED] escitalopram (LEXAPRO) 10 MG tablet TAKE 1 TABLET BY MOUTH DAILY FOR CHRONIC ANXIETY   [DISCONTINUED] olmesartan (BENICAR) 40 MG tablet TAKE 1 TABLET BY MOUTH  DAILY FOR BLOOD PRESSURE   [DISCONTINUED] rosuvastatin (CRESTOR) 40 MG tablet TAKE 1 TABLET BY MOUTH  DAILY FOR CHOLESTEROL   amLODipine (NORVASC) 10 MG tablet TAKE 1 TABLET BY MOUTH  DAILY FOR BLOOD PRESSURE   bisoprolol-hydrochlorothiazide (ZIAC) 10-6.25 MG tablet TAKE 1 TABLET BY MOUTH  DAILY FOR BLOOD PRESSURE   escitalopram (LEXAPRO) 10 MG tablet TAKE 1 TABLET BY MOUTH DAILY FOR CHRONIC ANXIETY   olmesartan (BENICAR) 40 MG tablet TAKE 1 TABLET BY MOUTH  DAILY FOR BLOOD PRESSURE   rosuvastatin (CRESTOR) 40 MG tablet TAKE 1 TABLET BY MOUTH  DAILY FOR CHOLESTEROL   [DISCONTINUED] varenicline (CHANTIX CONTINUING MONTH PAK) 1 MG tablet Take 1 tablet (1 mg total) by mouth 2 (two) times daily. (Patient not taking: Reported on 10/30/2020)   No  facility-administered encounter medications on file as of 12/27/2021.    Follow-up: Return in about 6 months (around 06/29/2022) for cpe.  This visit occurred during the SARS-CoV-2 public health emergency.  Safety protocols were in place, including screening questions prior to the visit, additional usage of staff PPE, and extensive cleaning of exam room while observing appropriate contact time as indicated for disinfecting solutions.    Romilda Garret, NP

## 2021-12-27 NOTE — Assessment & Plan Note (Signed)
She was maintained on rosuvastatin 40 mg.  We will continue medication for patient pending lipid panel in office today. ?

## 2021-12-27 NOTE — Assessment & Plan Note (Signed)
Patient was maintained on Wellbutrin 300 mg in 10 mg.  Patient has also been off medications for several weeks.  We will start patient back on Lexapro 10 mg nightly.  We will start patient back on bupropion 150 mg daily for 1 month, after 1 month we will switch him up to 300 mg like he was.  Did instruct patient if he has a hard time tolerating Lexapro 10 mg nightly he may affect.  May take half a tablet for 1 week then titrate back up to 1 tablet daily.  No HI/SI/AVH. ?

## 2021-12-27 NOTE — Assessment & Plan Note (Signed)
She has been on medication for several weeks.  Patient has been maintained on amlodipine, losartan, bisoprolol-hydrochlorothiazide.  Blood pressure elevated in office today.  Patient to check blood pressure at home daily for the next week and report via MyChart.  Will start medications back as listed above ?

## 2021-12-30 ENCOUNTER — Encounter: Payer: Self-pay | Admitting: Nurse Practitioner

## 2022-01-01 ENCOUNTER — Encounter: Payer: Self-pay | Admitting: Nurse Practitioner

## 2022-01-24 ENCOUNTER — Other Ambulatory Visit: Payer: Self-pay | Admitting: Nurse Practitioner

## 2022-01-24 DIAGNOSIS — F419 Anxiety disorder, unspecified: Secondary | ICD-10-CM

## 2022-02-02 ENCOUNTER — Other Ambulatory Visit: Payer: Self-pay | Admitting: Adult Health

## 2022-02-02 DIAGNOSIS — E782 Mixed hyperlipidemia: Secondary | ICD-10-CM

## 2022-02-02 DIAGNOSIS — I10 Essential (primary) hypertension: Secondary | ICD-10-CM

## 2022-02-03 ENCOUNTER — Other Ambulatory Visit: Payer: Self-pay | Admitting: Adult Health Nurse Practitioner

## 2022-02-03 DIAGNOSIS — I10 Essential (primary) hypertension: Secondary | ICD-10-CM

## 2022-02-22 ENCOUNTER — Other Ambulatory Visit: Payer: Self-pay | Admitting: Nurse Practitioner

## 2022-02-22 DIAGNOSIS — F32A Depression, unspecified: Secondary | ICD-10-CM

## 2022-02-24 ENCOUNTER — Encounter: Payer: Self-pay | Admitting: Nurse Practitioner

## 2022-02-24 ENCOUNTER — Telehealth: Payer: Self-pay | Admitting: Nurse Practitioner

## 2022-02-24 DIAGNOSIS — I10 Essential (primary) hypertension: Secondary | ICD-10-CM

## 2022-02-24 DIAGNOSIS — E782 Mixed hyperlipidemia: Secondary | ICD-10-CM

## 2022-02-24 DIAGNOSIS — F419 Anxiety disorder, unspecified: Secondary | ICD-10-CM

## 2022-02-24 MED ORDER — OLMESARTAN MEDOXOMIL 40 MG PO TABS: ORAL_TABLET | ORAL | 3 refills | Status: DC

## 2022-02-24 MED ORDER — BISOPROLOL-HYDROCHLOROTHIAZIDE 10-6.25 MG PO TABS: ORAL_TABLET | ORAL | 3 refills | Status: DC

## 2022-02-24 MED ORDER — BUPROPION HCL ER (XL) 150 MG PO TB24: 150.0000 mg | ORAL_TABLET | Freq: Every day | ORAL | 1 refills | Status: DC

## 2022-02-24 MED ORDER — AMLODIPINE BESYLATE 10 MG PO TABS: ORAL_TABLET | ORAL | 3 refills | Status: DC

## 2022-02-24 MED ORDER — ESCITALOPRAM OXALATE 10 MG PO TABS: ORAL_TABLET | ORAL | 3 refills | Status: DC

## 2022-02-24 MED ORDER — ROSUVASTATIN CALCIUM 40 MG PO TABS: ORAL_TABLET | ORAL | 3 refills | Status: DC

## 2022-02-24 NOTE — Telephone Encounter (Signed)
It is ok [facility] ?

## 2022-02-24 NOTE — Telephone Encounter (Signed)
See mychart message about this, refills sent in ?

## 2022-07-04 ENCOUNTER — Encounter: Payer: BC Managed Care – PPO | Admitting: Nurse Practitioner

## 2022-07-14 ENCOUNTER — Other Ambulatory Visit: Payer: Self-pay | Admitting: Nurse Practitioner

## 2022-07-14 DIAGNOSIS — F419 Anxiety disorder, unspecified: Secondary | ICD-10-CM

## 2022-12-28 ENCOUNTER — Other Ambulatory Visit: Payer: Self-pay | Admitting: Nurse Practitioner

## 2022-12-28 DIAGNOSIS — E782 Mixed hyperlipidemia: Secondary | ICD-10-CM

## 2022-12-28 DIAGNOSIS — I10 Essential (primary) hypertension: Secondary | ICD-10-CM

## 2022-12-29 NOTE — Telephone Encounter (Signed)
Lvmtcb, sent mychart message  

## 2023-01-14 ENCOUNTER — Other Ambulatory Visit: Payer: Self-pay | Admitting: Nurse Practitioner

## 2023-01-14 DIAGNOSIS — F32A Depression, unspecified: Secondary | ICD-10-CM

## 2023-01-14 NOTE — Telephone Encounter (Signed)
Patient is scheduled for CPE

## 2023-01-28 ENCOUNTER — Other Ambulatory Visit: Payer: Self-pay | Admitting: Nurse Practitioner

## 2023-01-28 DIAGNOSIS — I10 Essential (primary) hypertension: Secondary | ICD-10-CM

## 2023-02-12 ENCOUNTER — Encounter: Payer: Self-pay | Admitting: Nurse Practitioner

## 2023-02-12 ENCOUNTER — Ambulatory Visit (INDEPENDENT_AMBULATORY_CARE_PROVIDER_SITE_OTHER): Payer: BC Managed Care – PPO | Admitting: Nurse Practitioner

## 2023-02-12 ENCOUNTER — Telehealth: Payer: Self-pay

## 2023-02-12 VITALS — BP 100/62 | HR 62 | Temp 98.3°F | Resp 16 | Ht 73.0 in | Wt 207.0 lb

## 2023-02-12 DIAGNOSIS — Z125 Encounter for screening for malignant neoplasm of prostate: Secondary | ICD-10-CM | POA: Diagnosis not present

## 2023-02-12 DIAGNOSIS — I10 Essential (primary) hypertension: Secondary | ICD-10-CM | POA: Diagnosis not present

## 2023-02-12 DIAGNOSIS — E559 Vitamin D deficiency, unspecified: Secondary | ICD-10-CM | POA: Diagnosis not present

## 2023-02-12 DIAGNOSIS — E663 Overweight: Secondary | ICD-10-CM

## 2023-02-12 DIAGNOSIS — Z1211 Encounter for screening for malignant neoplasm of colon: Secondary | ICD-10-CM

## 2023-02-12 DIAGNOSIS — Z23 Encounter for immunization: Secondary | ICD-10-CM

## 2023-02-12 DIAGNOSIS — Z0000 Encounter for general adult medical examination without abnormal findings: Secondary | ICD-10-CM

## 2023-02-12 DIAGNOSIS — F17200 Nicotine dependence, unspecified, uncomplicated: Secondary | ICD-10-CM | POA: Diagnosis not present

## 2023-02-12 DIAGNOSIS — E782 Mixed hyperlipidemia: Secondary | ICD-10-CM | POA: Diagnosis not present

## 2023-02-12 DIAGNOSIS — F32A Depression, unspecified: Secondary | ICD-10-CM | POA: Diagnosis not present

## 2023-02-12 DIAGNOSIS — F419 Anxiety disorder, unspecified: Secondary | ICD-10-CM

## 2023-02-12 DIAGNOSIS — Z122 Encounter for screening for malignant neoplasm of respiratory organs: Secondary | ICD-10-CM

## 2023-02-12 LAB — COMPREHENSIVE METABOLIC PANEL
ALT: 22 U/L (ref 0–53)
AST: 29 U/L (ref 0–37)
Albumin: 4.4 g/dL (ref 3.5–5.2)
Alkaline Phosphatase: 83 U/L (ref 39–117)
BUN: 14 mg/dL (ref 6–23)
CO2: 26 mEq/L (ref 19–32)
Calcium: 9.6 mg/dL (ref 8.4–10.5)
Chloride: 106 mEq/L (ref 96–112)
Creatinine, Ser: 0.96 mg/dL (ref 0.40–1.50)
GFR: 92.26 mL/min (ref >60.00)
Glucose, Bld: 85 mg/dL (ref 70–99)
Potassium: 4.3 mEq/L (ref 3.5–5.1)
Sodium: 142 mEq/L (ref 135–145)
Total Bilirubin: 0.3 mg/dL (ref 0.2–1.2)
Total Protein: 6.7 g/dL (ref 6.0–8.3)

## 2023-02-12 LAB — URINALYSIS, MICROSCOPIC ONLY: RBC / HPF: NONE SEEN (ref 0–?)

## 2023-02-12 LAB — LIPID PANEL
Cholesterol: 156 mg/dL (ref 0–200)
HDL: 39.5 mg/dL (ref >39.00)
LDL Cholesterol: 82 mg/dL (ref 0–99)
NonHDL: 116.9
Total CHOL/HDL Ratio: 4
Triglycerides: 175 mg/dL — ABNORMAL HIGH (ref 0.0–149.0)
VLDL: 35 mg/dL (ref 0.0–40.0)

## 2023-02-12 LAB — VITAMIN D 25 HYDROXY (VIT D DEFICIENCY, FRACTURES): VITD: 101.43 ng/mL (ref 30.00–100.00)

## 2023-02-12 LAB — PSA: PSA: 0.88 ng/mL (ref 0.10–4.00)

## 2023-02-12 LAB — CBC
HCT: 40.6 % (ref 39.0–52.0)
Hemoglobin: 13.7 g/dL (ref 13.0–17.0)
MCHC: 33.8 g/dL (ref 30.0–36.0)
MCV: 91.8 fl (ref 78.0–100.0)
Platelets: 379 10*3/uL (ref 150.0–400.0)
RBC: 4.42 Mil/uL (ref 4.22–5.81)
RDW: 13.6 % (ref 11.5–15.5)
WBC: 7.3 10*3/uL (ref 4.0–10.5)

## 2023-02-12 LAB — HEMOGLOBIN A1C: Hgb A1c MFr Bld: 6.3 % (ref 4.6–6.5)

## 2023-02-12 LAB — TSH: TSH: 0.24 u[IU]/mL — ABNORMAL LOW (ref 0.35–5.50)

## 2023-02-12 MED ORDER — OLMESARTAN MEDOXOMIL 40 MG PO TABS
40.0000 mg | ORAL_TABLET | Freq: Every day | ORAL | 3 refills | Status: AC
Start: 2023-02-12 — End: ?

## 2023-02-12 MED ORDER — BISOPROLOL-HYDROCHLOROTHIAZIDE 10-6.25 MG PO TABS
1.0000 | ORAL_TABLET | Freq: Every day | ORAL | 3 refills | Status: DC
Start: 2023-02-12 — End: 2023-05-25

## 2023-02-12 MED ORDER — AMLODIPINE BESYLATE 10 MG PO TABS: 10.0000 mg | ORAL_TABLET | Freq: Every day | ORAL | 3 refills | Status: DC

## 2023-02-12 MED ORDER — ROSUVASTATIN CALCIUM 40 MG PO TABS
ORAL_TABLET | ORAL | 3 refills | Status: DC
Start: 2023-02-12 — End: 2024-03-11

## 2023-02-12 MED ORDER — BUPROPION HCL ER (XL) 300 MG PO TB24
300.0000 mg | ORAL_TABLET | Freq: Every day | ORAL | 3 refills | Status: DC
Start: 2023-02-12 — End: 2024-02-01

## 2023-02-12 NOTE — Progress Notes (Signed)
Established Patient Office Visit  Subjective   Patient ID: Joshua Rivers, male    DOB: March 31, 1971  Age: 52 y.o. MRN: [id number]  Chief Complaint  Patient presents with   Annual Exam    HPI   for complete physical and follow up of chronic conditions.  HTN: Patient currently maintained on amlodipine 10 mg Ziac and olmesartan 40 mg. Does have a cuff at home. Tolerates the medication well   Anxiety patient currently maintained on Lexapro 10 mg and Wellbutrin 150 mg.  He is to be on Wellbutrin 300 mg. He states that he does not havv HI/SI/AVH   HLD: Patient on rosuvastatin 40 mg Immunizations: -Tetanus: Completed in 2021 -Influenza: Completed this season -Shingles:  update today  -Pneumonia: too young   Diet: Fair diet. States that he does 2 meals a day with some snacks. Propel 4 bottles a day  Exercise: No regular exercise. Emplyoment   Eye exam: PRN Dental exam: Completes semi-annually    Colonoscopy: Needs referral to  [facility]  Lung Cancer Screening: LDCT   PSA: Due  Sleep: states that he goes to bed around 7-9 and get up at 230. Feels rested most of the time. Unsure if he snores       Review of Systems  Constitutional:  Negative for chills and fever.  Respiratory:  Negative for shortness of breath.   Cardiovascular:  Negative for chest pain and leg swelling.  Gastrointestinal:  Negative for abdominal pain, blood in stool, constipation, diarrhea, nausea and vomiting.       BM daily   Genitourinary:  Negative for dysuria and hematuria.  Neurological:  Negative for tingling and headaches.  Psychiatric/Behavioral:  Negative for hallucinations and suicidal ideas.       Objective:     BP 100/62   Pulse 62   Temp 98.3 F (36.8 C)   Resp 16   Ht 6\' 1"  (1.854 m)   Wt 207 lb (93.9 kg)   SpO2 98%   BMI 27.31 kg/m    Physical Exam Vitals and nursing note reviewed.  Constitutional:      Appearance: Normal appearance.  HENT:     Right Ear: Tympanic  membrane, ear canal and external ear normal.     Left Ear: Tympanic membrane, ear canal and external ear normal.     Mouth/Throat:     Mouth: Mucous membranes are moist.     Pharynx: Oropharynx is clear.  Eyes:     Extraocular Movements: Extraocular movements intact.     Pupils: Pupils are equal, round, and reactive to light.  Cardiovascular:     Rate and Rhythm: Normal rate and regular rhythm.     Pulses: Normal pulses.     Heart sounds: Normal heart sounds.  Pulmonary:     Effort: Pulmonary effort is normal.     Breath sounds: Normal breath sounds.  Abdominal:     General: Bowel sounds are normal. There is no distension.     Palpations: There is no mass.     Tenderness: There is no abdominal tenderness.     Hernia: No hernia is present.  Genitourinary:    Comments: deferred Musculoskeletal:     Right lower leg: No edema.     Left lower leg: No edema.  Lymphadenopathy:     Cervical: No cervical adenopathy.  Skin:    General: Skin is warm.  Neurological:     General: No focal deficit present.     Mental Status: He  is alert.     Deep Tendon Reflexes:     Reflex Scores:      Bicep reflexes are 2+ on the right side and 2+ on the left side.      Patellar reflexes are 2+ on the right side and 2+ on the left side.    Comments: Bilateral upper and lower extremity strength 5/5  Psychiatric:        Mood and Affect: Mood normal.        Behavior: Behavior normal.        Thought Content: Thought content normal.        Judgment: Judgment normal.      No results found for any visits on 02/12/23.    The 10-year ASCVD risk score (Arnett DK, et al., 2019) is: 4.9%    Assessment & Plan:   Problem List Items Addressed This Visit       Cardiovascular and Mediastinum   Essential hypertension - Primary    Patient currently maintained on amlodipine, olmesartan, Ziac.  Blood pressure within normal limits patient tolerating medications well continue medication as prescribed       Relevant Medications   amLODipine (NORVASC) 10 MG tablet   bisoprolol-hydrochlorothiazide (ZIAC) 10-6.25 MG tablet   olmesartan (BENICAR) 40 MG tablet   rosuvastatin (CRESTOR) 40 MG tablet   Other Relevant Orders   CBC   Comprehensive metabolic panel   TSH     Other   Anxiety and depression    Patient currently maintained on bupropion 150 mg XR L and Lexapro 10 mg.  Patient used to be on Wellbutrin 300 mg and would like to titrate up new prescriptions today.  Patient has HI/SI/AVH.      Relevant Medications   buPROPion (WELLBUTRIN XL) 300 MG 24 hr tablet   Other Relevant Orders   TSH   Hyperlipidemia, mixed    Patient currently maintained on rosuvastatin 40 mg.  Pending lipid panel today      Relevant Medications   amLODipine (NORVASC) 10 MG tablet   bisoprolol-hydrochlorothiazide (ZIAC) 10-6.25 MG tablet   olmesartan (BENICAR) 40 MG tablet   rosuvastatin (CRESTOR) 40 MG tablet   Other Relevant Orders   Lipid panel   Overweight (BMI 25.0-29.9)    Pending TSH A1c and lipids.      Relevant Orders   Hemoglobin A1c   TSH   Lipid panel   Preventative health care    Discussed age-related immunizations and screening exams.  Did review patient's personal, social, surgical, family histories.  Patient is up-to-date on all vaccinations.  Did administer first shingles vaccine in office today.  Patient has not had CRC screening ambulatory referral to GI.  Patient's PSA was drawn today.  Patient was given information at discharge about preventative healthcare maintenance with anticipatory guidance for his age range      Relevant Orders   CBC   Comprehensive metabolic panel   TSH   Vitamin D deficiency    History of the same pending vitamin D level.      Relevant Orders   VITAMIN D 25 Hydroxy (Vit-D Deficiency, Fractures)   Smoker    Patient had periods of intermittent cessation.  Ambulatory referral to LDCT to see if he qualifies.  Microscopic urine today to rule out  microscopic hematuria.      Relevant Orders   Urine Microscopic   Other Visit Diagnoses     Screening for colon cancer       Relevant Orders  Ambulatory referral to Gastroenterology   Screening for prostate cancer       Relevant Orders   PSA   Screening for lung cancer       Relevant Orders   Ambulatory Referral Lung Cancer Screening [facility]   Need for shingles vaccine       Relevant Orders   Zoster Recombinant (Shingrix ) (Completed)       Return in about 1 year (around 02/12/2024) for CPE and Labs.    Audria Nine, NP

## 2023-02-12 NOTE — Assessment & Plan Note (Signed)
Patient currently maintained on amlodipine, olmesartan, Ziac.  Blood pressure within normal limits patient tolerating medications well continue medication as prescribed

## 2023-02-12 NOTE — Assessment & Plan Note (Signed)
Patient had periods of intermittent cessation.  Ambulatory referral to LDCT to see if he qualifies.  Microscopic urine today to rule out microscopic hematuria.

## 2023-02-12 NOTE — Assessment & Plan Note (Signed)
Pending TSH A1c and lipids.

## 2023-02-12 NOTE — Assessment & Plan Note (Signed)
Patient currently maintained on bupropion 150 mg XR L and Lexapro 10 mg.  Patient used to be on Wellbutrin 300 mg and would like to titrate up new prescriptions today.  Patient has HI/SI/AVH.

## 2023-02-12 NOTE — Patient Instructions (Signed)
Nice to see you today I have increased the bupropion to  daily I have sent in your medications to the mail delivery service Follow up with me in 1 year for your next physical and labs  You will need a nurse visit in 3 months to get your second shingles vaccine

## 2023-02-12 NOTE — Telephone Encounter (Signed)
Left message to return call to our office.  

## 2023-02-12 NOTE — Telephone Encounter (Signed)
CRITICAL VALUE STICKER  CRITICAL VALUE: Vit D  101.43  RECEIVER (on-site recipient of call): Siona Coulston   DATE & TIME NOTIFIED: 02/12/23 at 3:38  MESSENGER (representative from lab): Curley Spice   MD NOTIFIED:  Red book placed for review and message sent High [employer]   TIME OF NOTIFICATION: 3:44  RESPONSE: will wait for message from PCP

## 2023-02-12 NOTE — Telephone Encounter (Signed)
Can we call the patient and see if he is taking any type of vitamin D. If he is he needs to discontinue the use of it as his vitamin D level is high

## 2023-02-12 NOTE — Telephone Encounter (Signed)
Pt called back returning [facility] call. Pt requested a call back @ [phone]

## 2023-02-12 NOTE — Assessment & Plan Note (Signed)
History of the same pending vitamin D level 

## 2023-02-12 NOTE — Assessment & Plan Note (Signed)
Patient currently maintained on rosuvastatin 40 mg.  Pending lipid panel today

## 2023-02-12 NOTE — Telephone Encounter (Signed)
Called pt and he stated that he is taking Vitamin D I did advise him that matt said to stop taking it. He said that he understood and would quit taking it.

## 2023-02-12 NOTE — Assessment & Plan Note (Signed)
Discussed age-related immunizations and screening exams.  Did review patient's personal, social, surgical, family histories.  Patient is up-to-date on all vaccinations.  Did administer first shingles vaccine in office today.  Patient has not had CRC screening ambulatory referral to GI.  Patient's PSA was drawn today.  Patient was given information at discharge about preventative healthcare maintenance with anticipatory guidance for his age range

## 2023-02-13 ENCOUNTER — Other Ambulatory Visit (INDEPENDENT_AMBULATORY_CARE_PROVIDER_SITE_OTHER): Payer: BC Managed Care – PPO

## 2023-02-13 DIAGNOSIS — R7989 Other specified abnormal findings of blood chemistry: Secondary | ICD-10-CM | POA: Diagnosis not present

## 2023-02-13 LAB — T3, FREE: T3, Free: 2.9 pg/mL (ref 2.3–4.2)

## 2023-02-13 LAB — T4, FREE: Free T4: 0.67 ng/dL (ref 0.60–1.60)

## 2023-03-26 ENCOUNTER — Other Ambulatory Visit: Payer: Self-pay | Admitting: Nurse Practitioner

## 2023-03-26 DIAGNOSIS — I10 Essential (primary) hypertension: Secondary | ICD-10-CM

## 2023-03-26 DIAGNOSIS — E782 Mixed hyperlipidemia: Secondary | ICD-10-CM

## 2023-03-27 NOTE — Telephone Encounter (Signed)
Left message to return call to our office.  

## 2023-03-27 NOTE — Telephone Encounter (Signed)
Received refill requests from [facility] for olmesartan (BP pill) and rosuvastatin (cholesterol pill). These were sent to [facility] in April for a 1 year supply.  Which pharmacy is he using for these two medications?

## 2023-03-31 NOTE — Telephone Encounter (Signed)
Left message to return call to our office.  

## 2023-04-03 NOTE — Telephone Encounter (Signed)
Called patient x 3 no answer no call back. Left message to return call to our office. Sending my chart message to see if patient calls back.

## 2023-04-03 NOTE — Telephone Encounter (Signed)
Left message to return call to our office.  

## 2023-04-17 NOTE — Telephone Encounter (Addendum)
Have attempted to contact patient multiple times with no answer. How would you like me to move forward?

## 2023-05-23 ENCOUNTER — Other Ambulatory Visit: Payer: Self-pay | Admitting: Nurse Practitioner

## 2023-05-23 DIAGNOSIS — I10 Essential (primary) hypertension: Secondary | ICD-10-CM

## 2023-06-12 ENCOUNTER — Encounter: Payer: Self-pay | Admitting: *Deleted

## 2023-06-22 ENCOUNTER — Other Ambulatory Visit: Payer: Self-pay | Admitting: Nurse Practitioner

## 2023-06-22 DIAGNOSIS — I10 Essential (primary) hypertension: Secondary | ICD-10-CM

## 2023-06-22 DIAGNOSIS — E782 Mixed hyperlipidemia: Secondary | ICD-10-CM

## 2023-06-22 DIAGNOSIS — F32A Depression, unspecified: Secondary | ICD-10-CM

## 2023-06-22 DIAGNOSIS — F419 Anxiety disorder, unspecified: Secondary | ICD-10-CM

## 2023-07-10 ENCOUNTER — Other Ambulatory Visit: Payer: Self-pay | Admitting: Nurse Practitioner

## 2023-07-10 DIAGNOSIS — F419 Anxiety disorder, unspecified: Secondary | ICD-10-CM

## 2023-12-28 ENCOUNTER — Other Ambulatory Visit: Payer: Self-pay | Admitting: Nurse Practitioner

## 2023-12-28 DIAGNOSIS — F32A Depression, unspecified: Secondary | ICD-10-CM

## 2023-12-31 NOTE — Telephone Encounter (Signed)
 Patient needs a CPE in 2 months scheduled please

## 2023-12-31 NOTE — Telephone Encounter (Signed)
 LVM for patient to c/b and schedule.

## 2024-01-01 NOTE — Telephone Encounter (Signed)
 Lvmtcb, sent mychart message

## 2024-01-04 NOTE — Telephone Encounter (Signed)
 Lvmtcb

## 2024-02-01 ENCOUNTER — Other Ambulatory Visit: Payer: Self-pay | Admitting: Nurse Practitioner

## 2024-02-01 DIAGNOSIS — F32A Depression, unspecified: Secondary | ICD-10-CM

## 2024-02-05 ENCOUNTER — Other Ambulatory Visit: Payer: Self-pay | Admitting: Nurse Practitioner

## 2024-02-05 DIAGNOSIS — F419 Anxiety disorder, unspecified: Secondary | ICD-10-CM

## 2024-02-08 ENCOUNTER — Other Ambulatory Visit (HOSPITAL_COMMUNITY): Payer: Self-pay

## 2024-02-08 MED ORDER — BUPROPION HCL ER (XL) 300 MG PO TB24
300.0000 mg | ORAL_TABLET | Freq: Every day | ORAL | 0 refills | Status: AC
Start: 2024-02-08 — End: ?
  Filled 2024-02-08 – 2024-06-12 (×2): qty 90, 90d supply, fill #0

## 2024-02-09 ENCOUNTER — Other Ambulatory Visit (HOSPITAL_COMMUNITY): Payer: Self-pay

## 2024-02-09 ENCOUNTER — Other Ambulatory Visit: Payer: Self-pay

## 2024-02-09 ENCOUNTER — Encounter: Payer: Self-pay | Admitting: Pharmacist

## 2024-02-12 ENCOUNTER — Other Ambulatory Visit: Payer: Self-pay

## 2024-03-05 ENCOUNTER — Other Ambulatory Visit: Payer: Self-pay | Admitting: Nurse Practitioner

## 2024-03-05 DIAGNOSIS — I10 Essential (primary) hypertension: Secondary | ICD-10-CM

## 2024-03-10 ENCOUNTER — Other Ambulatory Visit: Payer: Self-pay | Admitting: Nurse Practitioner

## 2024-03-10 DIAGNOSIS — E782 Mixed hyperlipidemia: Secondary | ICD-10-CM

## 2024-03-18 ENCOUNTER — Ambulatory Visit: Admitting: Nurse Practitioner

## 2024-03-18 ENCOUNTER — Encounter: Payer: Self-pay | Admitting: Nurse Practitioner

## 2024-03-18 VITALS — BP 122/74 | HR 55 | Temp 97.9°F | Ht 71.75 in | Wt 213.4 lb

## 2024-03-18 DIAGNOSIS — Z23 Encounter for immunization: Secondary | ICD-10-CM | POA: Diagnosis not present

## 2024-03-18 DIAGNOSIS — F419 Anxiety disorder, unspecified: Secondary | ICD-10-CM | POA: Diagnosis not present

## 2024-03-18 DIAGNOSIS — I10 Essential (primary) hypertension: Secondary | ICD-10-CM | POA: Diagnosis not present

## 2024-03-18 DIAGNOSIS — F17200 Nicotine dependence, unspecified, uncomplicated: Secondary | ICD-10-CM

## 2024-03-18 DIAGNOSIS — E663 Overweight: Secondary | ICD-10-CM

## 2024-03-18 DIAGNOSIS — Z1211 Encounter for screening for malignant neoplasm of colon: Secondary | ICD-10-CM

## 2024-03-18 DIAGNOSIS — Z1159 Encounter for screening for other viral diseases: Secondary | ICD-10-CM

## 2024-03-18 DIAGNOSIS — E559 Vitamin D deficiency, unspecified: Secondary | ICD-10-CM | POA: Diagnosis not present

## 2024-03-18 DIAGNOSIS — Z114 Encounter for screening for human immunodeficiency virus [HIV]: Secondary | ICD-10-CM

## 2024-03-18 DIAGNOSIS — E782 Mixed hyperlipidemia: Secondary | ICD-10-CM

## 2024-03-18 DIAGNOSIS — Z125 Encounter for screening for malignant neoplasm of prostate: Secondary | ICD-10-CM

## 2024-03-18 DIAGNOSIS — Z0000 Encounter for general adult medical examination without abnormal findings: Secondary | ICD-10-CM | POA: Diagnosis not present

## 2024-03-18 DIAGNOSIS — F32A Depression, unspecified: Secondary | ICD-10-CM

## 2024-03-18 DIAGNOSIS — Z122 Encounter for screening for malignant neoplasm of respiratory organs: Secondary | ICD-10-CM

## 2024-03-18 MED ORDER — BUSPIRONE HCL 5 MG PO TABS: 5.0000 mg | ORAL_TABLET | Freq: Two times a day (BID) | ORAL | 1 refills | Status: DC

## 2024-03-18 NOTE — Assessment & Plan Note (Signed)
 Patient is send patient currently maintained on escitalopram  10 mg daily.  He has self stopped Wellbutrin  made him present for about a month.  We will start patient back on BuSpar 5 mg twice daily continue escitalopram  10 mg daily.  Patient denies HI/SI/AVH.  He will follow-up virtually with me in 6 weeks

## 2024-03-18 NOTE — Assessment & Plan Note (Signed)
History of the same pending vitamin D level today

## 2024-03-18 NOTE — Assessment & Plan Note (Signed)
 Patient has stopped over the past 2 months.  Pending urine microscopy rule out microscopic hematuria.  LDCT referral placed today

## 2024-03-18 NOTE — Assessment & Plan Note (Signed)
 Discussed age-appropriate immunizations and screening exams.  Did review patient's personal, surgical, social, family histories.  Patient is up-to-date with all age-appropriate vaccinations he would like.  Update second shingles vaccine today and Prevnar 20 today.  Order Cologuard for CRC screening.  PSA for prostate cancer screening today.  Patient was given information at discharge about preventative healthcare maintenance with anticipatory guidance.

## 2024-03-18 NOTE — Assessment & Plan Note (Signed)
 History of the same currently maintained on rosuvastatin  40 mg daily.  Pending lipid panel today

## 2024-03-18 NOTE — Patient Instructions (Addendum)
 Nice to see you today I will be in touch with the lab results once I have reviewed them  We did update your last shingles and your pneumonia vaccine today   Follow up with me in 4-6 week

## 2024-03-18 NOTE — Assessment & Plan Note (Signed)
 Pending TSH, A1c, lipid panel.

## 2024-03-18 NOTE — Assessment & Plan Note (Signed)
 Patient currently maintained on amlodipine  10 mg, olmesartan  40 mg, bisoprolol -hydrochlorothiazide  10-6.25 mg daily.  Blood pressure well-controlled.  Patient tolerating medications well.  Continue medication as prescribed

## 2024-03-18 NOTE — Progress Notes (Signed)
 Established Patient Office Visit  Subjective   Patient ID: Joshua Rivers, male    DOB: 10/29/1970  Age: 53 y.o. MRN: [id number]  Chief Complaint  Patient presents with   Annual Exam    Requests 2nd shingles   Medication Management    Wellbutrin . Pt complains of trying an alternative.     HPI  HTN: Patient currently maintained on amlodipine  10 mg daily, Ziac , olmesartan  40 mg daily. He will check it if he is off. States that It is good when he checks  Mood: Patient currently maintained on bupropion  300 mg daily and escitalopram  10 mg daily. He has not taking it for 30 days.  HLD: Patient currently maintained on rosuvastatin  40 mg daily  for complete physical and follow up of chronic conditions.  Immunizations: -Tetanus: Completed in 2021 -Influenza: out of season  -Shingles: Completed  1st Shingrix  update second 1 today -Pneumonia: Update today  Diet: Fair diet. He is eating 2 meals a day at least. He will snakc. He does drink sprite Exercise: No regular exercise. Work around [employer] exam: PRN Dental exam: Completes semi-annually    Colonoscopy: cologuard ordered Lung Cancer Screening: Ambulatory referral placed today  PSA: Due  Sleep: goes to bed around 7 and get up around 2. Feels rested. Unsure if he snore        Review of Systems  Constitutional:  Negative for chills and fever.  Respiratory:  Negative for shortness of breath.   Cardiovascular:  Negative for chest pain and leg swelling.  Gastrointestinal:  Negative for abdominal pain, blood in stool, constipation, diarrhea, nausea and vomiting.       BM every other day   Genitourinary:  Negative for dysuria and hematuria.  Neurological:  Negative for tingling and headaches.  Psychiatric/Behavioral:  Negative for hallucinations and suicidal ideas.       Objective:     BP 122/74   Pulse (!) 55   Temp 97.9 F (36.6 C) (Oral)   Ht 5' 11.75" (1.822 m)   Wt 213 lb 6.4 oz (96.8 kg)   SpO2 92%    BMI 29.14 kg/m  BP Readings from Last 3 Encounters:  03/18/24 122/74  02/12/23 100/62  12/27/21 (!) 160/104   Wt Readings from Last 3 Encounters:  03/18/24 213 lb 6.4 oz (96.8 kg)  02/12/23 207 lb (93.9 kg)  12/27/21 201 lb 6 oz (91.3 kg)   SpO2 Readings from Last 3 Encounters:  03/18/24 92%  02/12/23 98%  12/27/21 96%      Physical Exam Vitals and nursing note reviewed.  Constitutional:      Appearance: Normal appearance.  HENT:     Right Ear: Tympanic membrane, ear canal and external ear normal.     Left Ear: Tympanic membrane, ear canal and external ear normal.     Mouth/Throat:     Mouth: Mucous membranes are moist.     Pharynx: Oropharynx is clear.  Eyes:     Extraocular Movements: Extraocular movements intact.     Pupils: Pupils are equal, round, and reactive to light.  Cardiovascular:     Rate and Rhythm: Normal rate and regular rhythm.     Pulses: Normal pulses.     Heart sounds: Normal heart sounds.  Pulmonary:     Effort: Pulmonary effort is normal.     Breath sounds: Normal breath sounds.  Abdominal:     General: Bowel sounds are normal. There is no distension.  Palpations: There is no mass.     Tenderness: There is no abdominal tenderness.     Hernia: No hernia is present.  Genitourinary:    Comments: deferred Musculoskeletal:     Right lower leg: No edema.     Left lower leg: No edema.  Lymphadenopathy:     Cervical: No cervical adenopathy.  Skin:    General: Skin is warm.  Neurological:     General: No focal deficit present.     Mental Status: He is alert.     Deep Tendon Reflexes:     Reflex Scores:      Bicep reflexes are 2+ on the right side and 2+ on the left side.      Patellar reflexes are 2+ on the right side and 2+ on the left side.    Comments: Bilateral upper and lower extremity strength 5/5  Psychiatric:        Mood and Affect: Mood normal.        Behavior: Behavior normal.        Thought Content: Thought content normal.         Judgment: Judgment normal.      No results found for any visits on 03/18/24.    The 10-year ASCVD risk score (Arnett DK, et al., 2019) is: 3.7%    Assessment & Plan:   Problem List Items Addressed This Visit       Cardiovascular and Mediastinum   Essential hypertension   Patient currently maintained on amlodipine  10 mg, olmesartan  40 mg, bisoprolol -hydrochlorothiazide  10-6.25 mg daily.  Blood pressure well-controlled.  Patient tolerating medications well.  Continue medication as prescribed      Relevant Orders   CBC   Comprehensive metabolic panel with GFR   Hemoglobin A1c   TSH     Other   Anxiety and depression   Patient is send patient currently maintained on escitalopram  10 mg daily.  He has self stopped Wellbutrin  made him present for about a month.  We will start patient back on BuSpar 5 mg twice daily continue escitalopram  10 mg daily.  Patient denies HI/SI/AVH.  He will follow-up virtually with me in 6 weeks      Relevant Medications   busPIRone (BUSPAR) 5 MG tablet   Hyperlipidemia, mixed   History of the same currently maintained on rosuvastatin  40 mg daily.  Pending lipid panel today      Relevant Orders   Hemoglobin A1c   Lipid panel   Overweight (BMI 25.0-29.9)   Pending TSH, A1c, lipid panel      Preventative health care - Primary   Discussed age-appropriate immunizations and screening exams.  Did review patient's personal, surgical, social, family histories.  Patient is up-to-date with all age-appropriate vaccinations he would like.  Update second shingles vaccine today and Prevnar 20 today.  Order Cologuard for CRC screening.  PSA for prostate cancer screening today.  Patient was given information at discharge about preventative healthcare maintenance with anticipatory guidance.      Vitamin D  deficiency   History of the same pending vitamin D  level today      Relevant Orders   VITAMIN D  25 Hydroxy (Vit-D Deficiency, Fractures)   Smoker    Patient has stopped over the past 2 months.  Pending urine microscopy rule out microscopic hematuria.  LDCT referral placed today      Relevant Orders   Urine Microscopic   Other Visit Diagnoses       Encounter for hepatitis C  screening test for low risk patient       Relevant Orders   Hepatitis C Antibody     Encounter for screening for HIV       Relevant Orders   HIV antibody (with reflex)     Need for pneumococcal 20-valent conjugate vaccination       Relevant Orders   Pneumococcal conjugate vaccine 20-valent (Prevnar 20)     Need for shingles vaccine       Relevant Orders   Zoster Recombinant (Shingrix  )     Screening for prostate cancer       Relevant Orders   PSA     Screening for colon cancer       Relevant Orders   Cologuard     Screening for lung cancer       Relevant Orders   Ambulatory Referral Lung Cancer Screening [facility]       Return in about 6 weeks (around 04/29/2024) for GAD/MDD.    Margarie Shay, NP

## 2024-03-19 LAB — HEMOGLOBIN A1C
Hgb A1c MFr Bld: 5.5 % (ref <5.7)
Mean Plasma Glucose: 111 mg/dL
eAG (mmol/L): 6.2 mmol/L

## 2024-03-19 LAB — LIPID PANEL
Cholesterol: 197 mg/dL (ref <200)
HDL: 57 mg/dL (ref >=40)
LDL Cholesterol (Calc): 114 mg/dL — ABNORMAL HIGH
Non-HDL Cholesterol (Calc): 140 mg/dL — ABNORMAL HIGH (ref <130)
Total CHOL/HDL Ratio: 3.5 (calc) (ref <5.0)
Triglycerides: 150 mg/dL — ABNORMAL HIGH (ref <150)

## 2024-03-19 LAB — COMPREHENSIVE METABOLIC PANEL WITH GFR
AG Ratio: 2.5 (calc) (ref 1.0–2.5)
ALT: 37 U/L (ref 9–46)
AST: 34 U/L (ref 10–35)
Albumin: 4.9 g/dL (ref 3.6–5.1)
Alkaline phosphatase (APISO): 74 U/L (ref 35–144)
BUN: 15 mg/dL (ref 7–25)
CO2: 28 mmol/L (ref 20–32)
Calcium: 10 mg/dL (ref 8.6–10.3)
Chloride: 103 mmol/L (ref 98–110)
Creat: 1.02 mg/dL (ref 0.70–1.30)
Globulin: 2 g/dL (ref 1.9–3.7)
Glucose, Bld: 80 mg/dL (ref 65–99)
Potassium: 4.5 mmol/L (ref 3.5–5.3)
Sodium: 141 mmol/L (ref 135–146)
Total Bilirubin: 0.3 mg/dL (ref 0.2–1.2)
Total Protein: 6.9 g/dL (ref 6.1–8.1)
eGFR: 89 mL/min/{1.73_m2} (ref >=60)

## 2024-03-19 LAB — CBC
HCT: 43.1 % (ref 38.5–50.0)
Hemoglobin: 14.5 g/dL (ref 13.2–17.1)
MCH: 31 pg (ref 27.0–33.0)
MCHC: 33.6 g/dL (ref 32.0–36.0)
MCV: 92.1 fL (ref 80.0–100.0)
MPV: 9.6 fL (ref 7.5–12.5)
Platelets: 405 10*3/uL — ABNORMAL HIGH (ref 140–400)
RBC: 4.68 10*6/uL (ref 4.20–5.80)
RDW: 12.8 % (ref 11.0–15.0)
WBC: 9.4 10*3/uL (ref 3.8–10.8)

## 2024-03-19 LAB — URINALYSIS, MICROSCOPIC ONLY
Bacteria, UA: NONE SEEN /HPF
Hyaline Cast: NONE SEEN /LPF
RBC / HPF: NONE SEEN /HPF (ref 0–2)
Squamous Epithelial / HPF: NONE SEEN /HPF (ref <=5)
WBC, UA: NONE SEEN /HPF (ref 0–5)

## 2024-03-19 LAB — PSA: PSA: 0.64 ng/mL (ref <=4.00)

## 2024-03-19 LAB — VITAMIN D 25 HYDROXY (VIT D DEFICIENCY, FRACTURES): Vit D, 25-Hydroxy: 34 ng/mL (ref 30–100)

## 2024-03-19 LAB — HEPATITIS C ANTIBODY: Hepatitis C Ab: NONREACTIVE

## 2024-03-19 LAB — TSH: TSH: 0.81 m[IU]/L (ref 0.40–4.50)

## 2024-03-19 LAB — HIV ANTIBODY (ROUTINE TESTING W REFLEX): HIV 1&2 Ab, 4th Generation: NONREACTIVE

## 2024-03-22 ENCOUNTER — Other Ambulatory Visit: Payer: Self-pay | Admitting: Nurse Practitioner

## 2024-03-22 ENCOUNTER — Ambulatory Visit: Payer: Self-pay | Admitting: Nurse Practitioner

## 2024-03-22 DIAGNOSIS — F419 Anxiety disorder, unspecified: Secondary | ICD-10-CM

## 2024-06-12 ENCOUNTER — Encounter: Payer: Self-pay | Admitting: Nurse Practitioner

## 2024-06-13 ENCOUNTER — Other Ambulatory Visit: Payer: Self-pay

## 2024-06-13 ENCOUNTER — Other Ambulatory Visit (HOSPITAL_COMMUNITY): Payer: Self-pay

## 2024-06-14 ENCOUNTER — Other Ambulatory Visit: Payer: Self-pay | Admitting: Family

## 2024-06-14 DIAGNOSIS — F32A Depression, unspecified: Secondary | ICD-10-CM

## 2024-06-14 DIAGNOSIS — E782 Mixed hyperlipidemia: Secondary | ICD-10-CM

## 2024-06-14 MED ORDER — BUSPIRONE HCL 5 MG PO TABS: 5.0000 mg | ORAL_TABLET | Freq: Two times a day (BID) | ORAL | 1 refills | Status: DC

## 2024-06-14 MED ORDER — ROSUVASTATIN CALCIUM 40 MG PO TABS: ORAL_TABLET | ORAL | 0 refills | Status: DC

## 2024-06-16 ENCOUNTER — Other Ambulatory Visit: Payer: Self-pay

## 2024-07-20 ENCOUNTER — Other Ambulatory Visit (HOSPITAL_COMMUNITY): Payer: Self-pay

## 2024-08-30 ENCOUNTER — Other Ambulatory Visit: Payer: Self-pay | Admitting: Family

## 2024-08-30 DIAGNOSIS — F32A Depression, unspecified: Secondary | ICD-10-CM

## 2024-09-12 ENCOUNTER — Other Ambulatory Visit: Payer: Self-pay | Admitting: Family

## 2024-09-12 DIAGNOSIS — E782 Mixed hyperlipidemia: Secondary | ICD-10-CM

## 2024-09-27 NOTE — Telephone Encounter (Signed)
 Left message to return call to our office.

## 2024-09-27 NOTE — Telephone Encounter (Signed)
 Patient never followed up. Is medication doing ok? Please schedule CPE in 6 months

## 2024-10-12 NOTE — Telephone Encounter (Signed)
 Still have not heard from patient please advise
